# Patient Record
Sex: Female | Born: 1975 | Race: White | Hispanic: No | Marital: Married | Smoking: Never smoker
Health system: Southern US, Community
[De-identification: ages and names within clinical notes are randomized; demographics above are authoritative.]

## PROBLEM LIST (undated history)

## (undated) DIAGNOSIS — J45909 Unspecified asthma, uncomplicated: Secondary | ICD-10-CM

## (undated) DIAGNOSIS — G43909 Migraine, unspecified, not intractable, without status migrainosus: Secondary | ICD-10-CM

## (undated) DIAGNOSIS — I10 Essential (primary) hypertension: Secondary | ICD-10-CM

---

## 2002-08-28 DIAGNOSIS — J45909 Unspecified asthma, uncomplicated: Secondary | ICD-10-CM | POA: Insufficient documentation

## 2006-11-22 DIAGNOSIS — Z23 Encounter for immunization: Secondary | ICD-10-CM | POA: Insufficient documentation

## 2006-11-22 DIAGNOSIS — G43909 Migraine, unspecified, not intractable, without status migrainosus: Secondary | ICD-10-CM | POA: Insufficient documentation

## 2006-11-22 DIAGNOSIS — K219 Gastro-esophageal reflux disease without esophagitis: Secondary | ICD-10-CM | POA: Insufficient documentation

## 2007-02-21 DIAGNOSIS — J019 Acute sinusitis, unspecified: Secondary | ICD-10-CM | POA: Insufficient documentation

## 2007-05-09 DIAGNOSIS — M6740 Ganglion, unspecified site: Secondary | ICD-10-CM | POA: Insufficient documentation

## 2016-03-11 DIAGNOSIS — J383 Other diseases of vocal cords: Secondary | ICD-10-CM | POA: Insufficient documentation

## 2016-03-24 DIAGNOSIS — J4550 Severe persistent asthma, uncomplicated: Secondary | ICD-10-CM | POA: Insufficient documentation

## 2016-04-11 DIAGNOSIS — R490 Dysphonia: Secondary | ICD-10-CM | POA: Insufficient documentation

## 2017-08-08 DIAGNOSIS — F32A Depression, unspecified: Secondary | ICD-10-CM | POA: Insufficient documentation

## 2017-08-08 DIAGNOSIS — I10 Essential (primary) hypertension: Secondary | ICD-10-CM | POA: Insufficient documentation

## 2017-11-24 DIAGNOSIS — M1711 Unilateral primary osteoarthritis, right knee: Secondary | ICD-10-CM | POA: Insufficient documentation

## 2017-11-24 DIAGNOSIS — M1712 Unilateral primary osteoarthritis, left knee: Secondary | ICD-10-CM | POA: Insufficient documentation

## 2018-06-28 ENCOUNTER — Other Ambulatory Visit: Payer: Self-pay

## 2018-06-28 ENCOUNTER — Encounter: Payer: Self-pay | Admitting: Physical Therapy

## 2018-06-28 ENCOUNTER — Ambulatory Visit: Payer: BC Managed Care – PPO | Attending: Orthopedic Surgery | Admitting: Physical Therapy

## 2018-06-28 DIAGNOSIS — M25561 Pain in right knee: Secondary | ICD-10-CM

## 2018-06-28 DIAGNOSIS — M25661 Stiffness of right knee, not elsewhere classified: Secondary | ICD-10-CM | POA: Insufficient documentation

## 2018-06-28 DIAGNOSIS — M6281 Muscle weakness (generalized): Secondary | ICD-10-CM | POA: Diagnosis not present

## 2018-06-28 DIAGNOSIS — R600 Localized edema: Secondary | ICD-10-CM | POA: Diagnosis not present

## 2018-06-28 NOTE — Therapy (Signed)
[facility] [address] [place], [place], [address] Phone: [phone]   Fax:  [phone]  Physical Therapy Evaluation  Patient Details  Name: Jamie Grayeronya Craiger MRN: [id number] Date of Birth: 11/22/1975 Referring Provider (PT): Elijah Birkanile Caffrey MD   Encounter Date: 06/28/2018  PT End of Session - 06/28/18 1156    Visit Number  1    Number of Visits  12    Date for PT Re-Evaluation  08/09/18    Authorization Type  FOTO AT LEAST EVERY 5TH VISIT.  KX MODIFIER AFTER 15 VISITS.  PROGRESS NOTE AT 10TH VISIT.    PT Start Time  1015    PT Stop Time  1116    PT Time Calculation (min)  61 min    Activity Tolerance  Patient tolerated treatment well    Behavior During Therapy  WFL for tasks assessed/performed       History reviewed. No pertinent past medical history.  History reviewed. No pertinent surgical history.  There were no vitals filed for this visit.   Subjective Assessment - 06/28/18 1150    Subjective  COVID-19 screen performed prior to patient entering clinic.  The patient states she slipped on water in her home falling and bedning her right knee backwards resulting in an MCL tear.  She was in an knee immobilizer for several weeks and is now in a hinged brace wiht a paterallr orifice.  Her pain at rest is a 4/10 today but can rise to an 8/10 with increased up time and bending of her right knee.      Pertinent History  Asthma, HTN, Cholecystectomy, tubal ligation.    How long can you sit comfortably?  30 minutes.    How long can you stand comfortably?  <15 minutes.    How long can you walk comfortably?  Short distances around home.    Diagnostic tests  MRI.    Patient Stated Goals  Get back to normal.    Currently in Pain?  Yes    Pain Score  4     Pain Location  Knee    Pain Orientation  Right    Pain Descriptors / Indicators  Aching    Pain Type  Acute pain    Pain Onset  More than a month ago    Pain Frequency  Constant    Aggravating Factors   Increased up time and bending right knee.    Pain Relieving Factors  Rest, ice/heat, meds.         [facility] - 06/28/18 0001      Assessment   Medical Diagnosis  Complete MCL tear.    Referring Provider (PT)  Elijah Birkanile Caffrey MD    Onset Date/Surgical Date  --   05/25/18.     Precautions   Precautions  None      Restrictions   Weight Bearing Restrictions  --   WBAT over right LE with brace.     Balance Screen   Has the patient fallen in the past 6 months  Yes    How many times?  --   1.   Has the patient had a decrease in activity level because of a fear of falling?   No    Is the patient reluctant to leave their home because of a fear of falling?   No      Home Public house managernvironment   Living Environment  Private residence      Prior Function   Level  of Independence  Independent      Observation/Other Assessments   Focus on Therapeutic Outcomes (FOTO)   96% linmitation.      ROM / Strength   AROM / PROM / Strength  AROM;Strength      AROM   Overall AROM Comments  -10 degrees of right knee extension in supine and -5 degrees with gentle passive overpressure.  Flexion= 48 degrees.      Strength   Overall Strength Comments  Righthip flexion= 3-/5.  Patient has nearly no volitional contraction of her right quadriceps.      Palpation   Palpation comment  Very tendet to paltion over right knee medial joint line.      Special Tests   Other special tests  Circumferential measurement right knee (mid-patellar position) 2 cms > left.      Ambulation/Gait   Gait Comments  Antalgic gait wiht right knee brace donned with decreased stance time over her affected right LE.                Objective measurements completed on examination: See above findings.      [facility] Adult PT Treatment/Exercise - 06/28/18 0001      Modalities   Modalities  Electrical Stimulation;Vasopneumatic      Electrical Stimulation   Electrical Stimulation Location   Right medial knee.    Electrical Stimulation Action  Pre-mod.    Electrical Stimulation Parameters  80-150 Hz x 20 minutes.    Electrical Stimulation Goals  Pain      Vasopneumatic   Number Minutes Vasopneumatic   20 minutes    Vasopnuematic Location   --   Right knee.   Vasopneumatic Pressure  Low             PT Education - 06/28/18 1157    Education Details  QS, short duration ice massage.    Person(s) Educated  Patient    Methods  Explanation;Handout    Comprehension  Verbalized understanding;Returned demonstration          PT Long Term Goals - 06/28/18 1247      PT LONG TERM GOAL #1   Title  Independent with a HEP.    Time  6    Period  Weeks    Status  New      PT LONG TERM GOAL #2   Title  Active knee flexion to 120-125 degrees+ so the patient can perform functional tasks and do so with pain not > 2-3/10.    Time  6    Period  Weeks    Status  New      PT LONG TERM GOAL #3   Title  Increase right knee strength to a solid 4+/5 to provide good stability for accomplishment of functional activities.    Time  6    Period  Weeks    Status  New      PT LONG TERM GOAL #4   Title  Full active right knee extension in order to normalize gait.    Time  6    Period  Weeks    Status  New      PT LONG TERM GOAL #5   Title  Walk in clinic wihtout deviation.    Time  6    Period  Weeks             Plan - 06/28/18 1239    Clinical Impression Statement  The patient presents to OPPT with a diagnosis of  a complete tear of her right MCL.  Her pain is very high with increased up time over her right LE and bending.  As expected she has a loss of right knee range of motion and a decrease in her right quadriceps.  She has edema present as well.  Her gait is antalgic with decreased stance time over her right LE.  Patient will benefit from skilled physical therapy intervention to address deficits and pain.    Examination-Activity Limitations  Bathing;Squat     Examination-Participation Restrictions  Community Activity    Stability/Clinical Decision Making  Stable/Uncomplicated    Clinical Decision Making  Low    Rehab Potential  Good    PT Frequency  2x / week    PT Duration  6 weeks    PT Treatment/Interventions  ADLs/Self Care Home Management;Cryotherapy;Electrical Stimulation;Moist Heat;Ultrasound;Therapeutic exercise;Therapeutic activities;Functional mobility training;Stair training;Gait training;Neuromuscular re-education;Patient/family education;Manual techniques;Passive range of motion;Vasopneumatic Device    PT Next Visit Plan  Nustep on level with UE use as well, VMS to right quads, seat knee flexion and heel slides, SLR's, electrical stimulation and vasopneumatic.       Patient will benefit from skilled therapeutic intervention in order to improve the following deficits and impairments:  Pain, Abnormal gait, Decreased range of motion, Decreased strength, Increased edema, Difficulty walking  Visit Diagnosis: Acute pain of right knee - Plan: PT plan of care cert/re-cert  Stiffness of right knee, not elsewhere classified - Plan: PT plan of care cert/re-cert  Muscle weakness (generalized) - Plan: PT plan of care cert/re-cert  Localized edema - Plan: PT plan of care cert/re-cert     Problem List There are no active problems to display for this patient.   Christella App, [place] MPT 06/28/2018, 12:50 PM  [facility] [address] [place], [place], [address] Phone: [phone]   Fax:  [phone]  Name: Blaise Palladino MRN: [id number] Date of Birth: 1976/01/12

## 2018-07-02 ENCOUNTER — Encounter: Payer: Self-pay | Admitting: Physical Therapy

## 2018-07-02 ENCOUNTER — Other Ambulatory Visit: Payer: Self-pay

## 2018-07-02 ENCOUNTER — Ambulatory Visit: Payer: BC Managed Care – PPO | Admitting: Physical Therapy

## 2018-07-02 DIAGNOSIS — M6281 Muscle weakness (generalized): Secondary | ICD-10-CM | POA: Diagnosis not present

## 2018-07-02 DIAGNOSIS — M25561 Pain in right knee: Secondary | ICD-10-CM

## 2018-07-02 DIAGNOSIS — M25661 Stiffness of right knee, not elsewhere classified: Secondary | ICD-10-CM

## 2018-07-02 DIAGNOSIS — R600 Localized edema: Secondary | ICD-10-CM

## 2018-07-02 NOTE — Therapy (Signed)
Jamie Doyle, [place], [address] Phone: [phone]   Fax:  [phone]  Physical Therapy Treatment  Patient Details  Name: Jamie Doyle MRN: [id number] Date of Birth: August 20, 1975 Referring Provider (PT): Jamie Gamma MD   Encounter Date: 07/02/2018  PT End of Session - 07/02/18 1112    Visit Number  2    Number of Visits  12    Date for PT Re-Evaluation  08/09/18    Authorization Type  FOTO AT LEAST EVERY 5TH VISIT.  KX MODIFIER AFTER 15 VISITS.  PROGRESS NOTE AT 10TH VISIT.    PT Start Time  1109    PT Stop Time  1204    PT Time Calculation (min)  55 min    Activity Tolerance  Patient tolerated treatment well    Behavior During Therapy  Jamie Doyle for tasks assessed/performed       History reviewed. No pertinent past medical history.  History reviewed. No pertinent surgical history.  There were no vitals filed for this visit.  Subjective Assessment - 07/02/18 1105    Subjective  COVID 19 screening performed upon arrival at clinic. Reports she still has a lot of pain. This is the first time she has walked without crutches and states that she would not be able to tolerate long distance walking.     Pertinent History  Asthma, HTN, Cholecystectomy, tubal ligation.    How long can you sit comfortably?  30 minutes.    How long can you stand comfortably?  <15 minutes.    How long can you walk comfortably?  Short distances around home.    Diagnostic tests  MRI.    Patient Stated Goals  Get back to normal.    Currently in Pain?  Other (Comment)   No pain assessment provided by patient        [facility] PT Assessment - 07/02/18 0001      Assessment   Medical Diagnosis  Complete MCL tear.    Referring Provider (PT)  Jamie Gamma MD    Onset Date/Surgical Date  05/25/18      Precautions   Precautions  None                   [facility] Adult PT Treatment/Exercise - 07/02/18 0001      Exercises   Exercises   Knee/Hip      Knee/Hip Exercises: Supine   Heel Slides  AROM;Right;3 sets;10 reps      Modalities   Modalities  Corporate treasurer Location  R VMO/Quad    Electrical Stimulation Action  VMS with QS    Electrical Stimulation Parameters  10/10, 300usec, 35 hz x10 min    Electrical Stimulation Goals  Neuromuscular facilitation      Ultrasound   Ultrasound Location  R medial knee    Ultrasound Parameters  1.2 cm, 100%, 1 mhz x10 min    Ultrasound Goals  Pain      Vasopneumatic   Number Minutes Vasopneumatic   15 minutes    Vasopnuematic Location   Knee    Vasopneumatic Pressure  Medium    Vasopneumatic Temperature   34                  PT Long Term Goals - 07/02/18 1137      PT LONG TERM GOAL #1   Title  Independent with a HEP.    Time  6    Period  Weeks    Status  Achieved      PT LONG TERM GOAL #2   Title  Active knee flexion to 120-125 degrees+ so the patient can perform functional tasks and do so with pain not > 2-3/10.    Time  6    Period  Weeks    Status  On-going      PT LONG TERM GOAL #3   Title  Increase right knee strength to a solid 4+/5 to provide good stability for accomplishment of functional activities.    Time  6    Period  Weeks    Status  On-going      PT LONG TERM GOAL #4   Title  Full active right knee extension in order to normalize gait.    Time  6    Period  Weeks    Status  On-going      PT LONG TERM GOAL #5   Title  Walk in clinic wihtout deviation.    Time  6    Period  Weeks    Status  On-going            Plan - 07/02/18 1137    Clinical Impression Statement  Patient presented in clinic with continued pain with ambulation and activities. Patient arrived in clinic with velcro knee brace on R knee. Patient still limited with knee flexion and quad activiation at this time although compliant with HEP. Normal modalities response noted  following removal of the modalities. Antalgia observed during ambulation with the velcro brace donned.     Examination-Activity Limitations  Bathing;Squat    Examination-Participation Restrictions  Community Activity    Stability/Clinical Decision Making  Stable/Uncomplicated    Rehab Potential  Good    PT Frequency  2x / week    PT Duration  6 weeks    PT Treatment/Interventions  ADLs/Self Care Home Management;Cryotherapy;Electrical Stimulation;Moist Heat;Ultrasound;Therapeutic exercise;Therapeutic activities;Functional mobility training;Stair training;Gait training;Neuromuscular re-education;Patient/family education;Manual techniques;Passive range of motion;Vasopneumatic Device    PT Next Visit Plan  Nustep on level with UE use as well, VMS to right quads, seat knee flexion and heel slides, SLR's, electrical stimulation and vasopneumatic.    Consulted and Agree with Plan of Care  Patient       Patient will benefit from skilled therapeutic intervention in order to improve the following deficits and impairments:  Pain, Abnormal gait, Decreased range of motion, Decreased strength, Increased edema, Difficulty walking  Visit Diagnosis: Acute pain of right knee  Stiffness of right knee, not elsewhere classified  Muscle weakness (generalized)  Localized edema     Problem List There are no active problems to display for this patient.   Jamie Doyle, PTA 07/02/2018, 12:20 PM  [facility] [address] [place], [place], [address] Phone: [phone]   Fax:  [phone]  Name: Jamie Grayeronya Doyle MRN: [id number] Date of Birth: 11/27/1975

## 2018-07-04 ENCOUNTER — Encounter: Payer: BC Managed Care – PPO | Admitting: Physical Therapy

## 2018-07-06 ENCOUNTER — Other Ambulatory Visit: Payer: Self-pay

## 2018-07-06 ENCOUNTER — Ambulatory Visit: Payer: BC Managed Care – PPO | Admitting: *Deleted

## 2018-07-06 DIAGNOSIS — R600 Localized edema: Secondary | ICD-10-CM

## 2018-07-06 DIAGNOSIS — M6281 Muscle weakness (generalized): Secondary | ICD-10-CM | POA: Diagnosis not present

## 2018-07-06 DIAGNOSIS — M25561 Pain in right knee: Secondary | ICD-10-CM

## 2018-07-06 DIAGNOSIS — M25661 Stiffness of right knee, not elsewhere classified: Secondary | ICD-10-CM

## 2018-07-06 NOTE — Therapy (Signed)
[facility] [address] [place], [place], [address] Phone: [phone]   Fax:  [phone]  Physical Therapy Treatment  Patient Details  Name: Jamie Doyle MRN: [id number] Date of Birth: 03/13/1975 Referring Provider (PT): Elijah Birkanile Caffrey MD   Encounter Date: 07/06/2018  PT End of Session - 07/06/18 1009    Visit Number  3    Number of Visits  12    Date for PT Re-Evaluation  08/09/18    Authorization Type  FOTO AT LEAST EVERY 5TH VISIT.  KX MODIFIER AFTER 15 VISITS.  PROGRESS NOTE AT 10TH VISIT.    PT Start Time  0910    PT Stop Time  1010    PT Time Calculation (min)  60 min       No past medical history on file.  No past surgical history on file.  There were no vitals filed for this visit.  Subjective Assessment - 07/06/18 0911    Subjective  COVID 19 screening performed upon arrival at clinic. Went to MD yesterday and he said continue with PT x 4 weeks and we will reasses then. Doing about the same.    Pertinent History  Asthma, HTN, Cholecystectomy, tubal ligation.    How long can you sit comfortably?  30 minutes.    How long can you stand comfortably?  <15 minutes.    How long can you walk comfortably?  Short distances around home.    Diagnostic tests  MRI.    Patient Stated Goals  Get back to normal.    Currently in Pain?  Yes    Pain Score  4     Pain Location  Knee    Pain Orientation  Right    Pain Type  Acute pain    Pain Onset  More than a month ago    Pain Frequency  Constant                       [facility] Adult PT Treatment/Exercise - 07/06/18 0001      Exercises   Exercises  Knee/Hip      Knee/Hip Exercises: Supine   Heel Slides  AROM;Right   x 5 mins   Straight Leg Raises  AAROM;Right;4 sets;10 reps   2 sets with hip ER     Modalities   Modalities  Electrical Stimulation;Ultrasound;Vasopneumatic      Programme researcher, broadcasting/film/videolectrical Stimulation   Electrical Stimulation Location  R VMO/Quad    Electrical Stimulation Action  VMS with QS    Electrical Stimulation Parameters  10 secs on/off x 15 mins    Electrical Stimulation Goals  Neuromuscular facilitation      Vasopneumatic   Number Minutes Vasopneumatic   15 minutes    Vasopnuematic Location   Knee    Vasopneumatic Pressure  Medium    Vasopneumatic Temperature   34                  PT Long Term Goals - 07/02/18 1137      PT LONG TERM GOAL #1   Title  Independent with a HEP.    Time  6    Period  Weeks    Status  Achieved      PT LONG TERM GOAL #2   Title  Active knee flexion to 120-125 degrees+ so the patient can perform functional tasks and do so with pain not > 2-3/10.    Time  6    Period  Weeks  Status  On-going      PT LONG TERM GOAL #3   Title  Increase right knee strength to a solid 4+/5 to provide good stability for accomplishment of functional activities.    Time  6    Period  Weeks    Status  On-going      PT LONG TERM GOAL #4   Title  Full active right knee extension in order to normalize gait.    Time  6    Period  Weeks    Status  On-going      PT LONG TERM GOAL #5   Title  Walk in clinic wihtout deviation.    Time  6    Period  Weeks    Status  On-going            Plan - 07/06/18 1012    Clinical Impression Statement  Pt arrived today doing fairly well and reports that she went to see her MD yesterday. He said to continue with PT at this time and he will re-assess in 4 weeks. She did fairly well with Rx today with improved SLR and quad activation. VMS increased to 15 mins today with quad sets and tolerated well Flexion to 102 degrees today.Normal modality response today after removal of modalities.    Examination-Activity Limitations  Bathing;Squat    Examination-Participation Restrictions  Community Activity    Stability/Clinical Decision Making  Stable/Uncomplicated    Rehab Potential  Good    PT Frequency  2x / week    PT Duration  6 weeks    PT  Treatment/Interventions  ADLs/Self Care Home Management;Cryotherapy;Electrical Stimulation;Moist Heat;Ultrasound;Therapeutic exercise;Therapeutic activities;Functional mobility training;Stair training;Gait training;Neuromuscular re-education;Patient/family education;Manual techniques;Passive range of motion;Vasopneumatic Device    PT Next Visit Plan  Nustep on level with UE use as well, VMS to right quads, seat knee flexion and heel slides, SLR's, electrical stimulation and vasopneumatic.       Patient will benefit from skilled therapeutic intervention in order to improve the following deficits and impairments:  Pain, Abnormal gait, Decreased range of motion, Decreased strength, Increased edema, Difficulty walking  Visit Diagnosis:  Acute pain of right knee   Stiffness of right knee, not elsewhere classified   Muscle weakness (generalized)   Localized edema *  Problem List There are no active problems to display for this patient.   Starr Urias,[place], PTA 07/06/2018, 11:10 AM  [facility] [place], [place], [address] Phone: [phone]   Fax:  [phone]  Name: Evelyn Aguinaldo MRN: [id number] Date of Birth: 1975/03/22

## 2018-07-09 ENCOUNTER — Other Ambulatory Visit: Payer: Self-pay

## 2018-07-09 ENCOUNTER — Ambulatory Visit: Payer: BC Managed Care – PPO | Admitting: Physical Therapy

## 2018-07-09 DIAGNOSIS — M25661 Stiffness of right knee, not elsewhere classified: Secondary | ICD-10-CM | POA: Diagnosis not present

## 2018-07-09 DIAGNOSIS — M25561 Pain in right knee: Secondary | ICD-10-CM

## 2018-07-09 DIAGNOSIS — M6281 Muscle weakness (generalized): Secondary | ICD-10-CM | POA: Diagnosis not present

## 2018-07-09 DIAGNOSIS — R600 Localized edema: Secondary | ICD-10-CM | POA: Diagnosis not present

## 2018-07-09 NOTE — Therapy (Signed)
[facility] [place], [place], [address] Phone: [phone]   Fax:  [phone]  Physical Therapy Treatment  Patient Details  Name: Jamie Doyle MRN: [id number] Date of Birth: December 30, 1975 Referring Provider (PT): Jovita Gamma MD   Encounter Date: 07/09/2018  PT End of Session - 07/09/18 1541    Visit Number  4    Number of Visits  12    Date for PT Re-Evaluation  08/09/18    Authorization Type  FOTO AT LEAST EVERY 5TH VISIT.  KX MODIFIER AFTER 15 VISITS.  PROGRESS NOTE AT 10TH VISIT.    PT Start Time  0204    PT Stop Time  0309    PT Time Calculation (min)  65 min    Activity Tolerance  Patient tolerated treatment well    Behavior During Therapy  [facility] for tasks assessed/performed       No past medical history on file.  No past surgical history on file.  There were no vitals filed for this visit.  Subjective Assessment - 07/09/18 1533    Subjective  COVID-19 screen performed prior to patient entering clinic.  Outside at ball games this weekend.  Took my brace off and got a sunburn on my legs.    Pertinent History  Asthma, HTN, Cholecystectomy, tubal ligation.    How long can you sit comfortably?  30 minutes.    How long can you stand comfortably?  <15 minutes.    How long can you walk comfortably?  Short distances around home.    Diagnostic tests  MRI.    Patient Stated Goals  Get back to normal.    Currently in Pain?  Yes    Pain Score  4     Pain Location  Knee    Pain Orientation  Right    Pain Descriptors / Indicators  Aching    Pain Type  Acute pain    Pain Onset  More than a month ago                       [facility] Adult PT Treatment/Exercise - 07/09/18 0001      Exercises   Exercises  Knee/Hip      Knee/Hip Exercises: Aerobic   Nustep  Level 1 using UE to propel LE's x 15 minutes moving seat forward x 2 to increase left knee flexion.      Knee/Hip Exercises: Supine   Short Arc Quad  Sets Limitations  15 minutes facilitated with VMS to patient's right medial quads (10 sec extension holds and 10 sec rest).      Modalities   Modalities  Vasopneumatic      Electrical Stimulation   Electrical Stimulation Location  Right medial knee.    Electrical Stimulation Action  Pre-mod.    Electrical Stimulation Parameters  80-150 Hz x 20 minutes.    Electrical Stimulation Goals  Pain                  PT Long Term Goals - 07/02/18 1137      PT LONG TERM GOAL #1   Title  Independent with a HEP.    Time  6    Period  Weeks    Status  Achieved      PT LONG TERM GOAL #2   Title  Active knee flexion to 120-125 degrees+ so the patient can perform functional tasks and do so with pain not > 2-3/10.  Time  6    Period  Weeks    Status  On-going      PT LONG TERM GOAL #3   Title  Increase right knee strength to a solid 4+/5 to provide good stability for accomplishment of functional activities.    Time  6    Period  Weeks    Status  On-going      PT LONG TERM GOAL #4   Title  Full active right knee extension in order to normalize gait.    Time  6    Period  Weeks    Status  On-going      PT LONG TERM GOAL #5   Title  Walk in clinic wihtout deviation.    Time  6    Period  Weeks    Status  On-going            Plan - 07/09/18 1538    Clinical Impression Statement  Excellent job today with the addition of the Nustep and progression to SAQ's today.  Patient with sunburn on leg due to being outside at ball games without brace.    Stability/Clinical Decision Making  Stable/Uncomplicated    Rehab Potential  Good    PT Frequency  2x / week    PT Duration  6 weeks    PT Treatment/Interventions  ADLs/Self Care Home Management;Cryotherapy;Electrical Stimulation;Moist Heat;Ultrasound;Therapeutic exercise;Therapeutic activities;Functional mobility training;Stair training;Gait training;Neuromuscular re-education;Patient/family education;Manual techniques;Passive  range of motion;Vasopneumatic Device    PT Next Visit Plan  Nustep on level with UE use as well, VMS to right quads, seat knee flexion and heel slides, SLR's, electrical stimulation and vasopneumatic.    Consulted and Agree with Plan of Care  Patient       Patient will benefit from skilled therapeutic intervention in order to improve the following deficits and impairments:  Pain, Abnormal gait, Decreased range of motion, Decreased strength, Increased edema, Difficulty walking  Visit Diagnosis: Acute pain of right knee.    Problem List There are no active problems to display for this patient.   Jamie Doyle, [place] MPT 07/09/2018, 3:42 PM  [facility] [address] [place], [place], [address] Phone: [phone]   Fax:  [phone]  Name: Jamie Doyle MRN: [id number] Date of Birth: 08/28/1975

## 2018-07-11 ENCOUNTER — Ambulatory Visit: Payer: BC Managed Care – PPO | Admitting: Physical Therapy

## 2018-07-11 ENCOUNTER — Other Ambulatory Visit: Payer: Self-pay

## 2018-07-11 DIAGNOSIS — R600 Localized edema: Secondary | ICD-10-CM

## 2018-07-11 DIAGNOSIS — M25561 Pain in right knee: Secondary | ICD-10-CM | POA: Diagnosis not present

## 2018-07-11 DIAGNOSIS — M6281 Muscle weakness (generalized): Secondary | ICD-10-CM | POA: Diagnosis not present

## 2018-07-11 DIAGNOSIS — M25661 Stiffness of right knee, not elsewhere classified: Secondary | ICD-10-CM

## 2018-07-11 NOTE — Therapy (Signed)
[facility] [place], [place], [address] Phone: [phone]   Fax:  [phone]  Physical Therapy Treatment  Patient Details  Name: Jamie Doyle MRN: [id number] Date of Birth: February 18, 1975 Referring Provider (PT): Jovita Gamma MD   Encounter Date: 07/11/2018  PT End of Session - 07/11/18 1347    Visit Number  5    Number of Visits  12    Date for PT Re-Evaluation  08/09/18    Authorization Type  FOTO AT LEAST EVERY 5TH VISIT.  KX MODIFIER AFTER 15 VISITS.  PROGRESS NOTE AT 10TH VISIT.    PT Start Time  0100    PT Stop Time  0158    PT Time Calculation (min)  58 min    Activity Tolerance  Patient tolerated treatment well    Behavior During Therapy  [facility] for tasks assessed/performed       No past medical history on file.  No past surgical history on file.  There were no vitals filed for this visit.                    [facility] Adult PT Treatment/Exercise - 07/11/18 0001      Exercises   Exercises  Knee/Hip      Knee/Hip Exercises: Aerobic   Nustep  Level 2 moving forward x 2 to increase right knee flexion (brace on) x 15 minutes.      Knee/Hip Exercises: Supine   Short Arc Quad Sets Limitations  16 minutes facilitated with Bi-Phasic e'stim to medial quads (10 sec on and 10 sec rest).      Acupuncturist Location  Right medial knee.    Electrical Stimulation Action  Pre-mod.    Electrical Stimulation Parameters  80-150 Hz x 20 minutes.    Electrical Stimulation Goals  Pain                  PT Long Term Goals - 07/02/18 1137      PT LONG TERM GOAL #1   Title  Independent with a HEP.    Time  6    Period  Weeks    Status  Achieved      PT LONG TERM GOAL #2   Title  Active knee flexion to 120-125 degrees+ so the patient can perform functional tasks and do so with pain not > 2-3/10.    Time  6    Period  Weeks    Status  On-going      PT LONG TERM  GOAL #3   Title  Increase right knee strength to a solid 4+/5 to provide good stability for accomplishment of functional activities.    Time  6    Period  Weeks    Status  On-going      PT LONG TERM GOAL #4   Title  Full active right knee extension in order to normalize gait.    Time  6    Period  Weeks    Status  On-going      PT LONG TERM GOAL #5   Title  Walk in clinic wihtout deviation.    Time  6    Period  Weeks    Status  On-going            Plan - 07/11/18 1359    Clinical Impression Statement  Excellent progress thus far with right knee pain decreasing and range of motion increasing.  PT Treatment/Interventions  ADLs/Self Care Home Management;Cryotherapy;Electrical Stimulation;Moist Heat;Ultrasound;Therapeutic exercise;Therapeutic activities;Functional mobility training;Stair training;Gait training;Neuromuscular re-education;Patient/family education;Manual techniques;Passive range of motion;Vasopneumatic Device    PT Next Visit Plan  Nustep on level with UE use as well, VMS to right quads, seat knee flexion and heel slides, SLR's, electrical stimulation and vasopneumatic.       Patient will benefit from skilled therapeutic intervention in order to improve the following deficits and impairments:     Visit Diagnosis: 1. Acute pain of right knee   2. Stiffness of right knee, not elsewhere classified   3. Muscle weakness (generalized)   4. Localized edema        Problem List There are no active problems to display for this patient.   Jerami Tammen, [place] MPT 07/11/2018, 2:00 PM  [facility] [address] [place], [place], [address] Phone: [phone]   Fax:  [phone]  Name: Jamie Grayeronya Craiger MRN: [id number] Date of Birth: 08/05/1975

## 2018-07-16 ENCOUNTER — Ambulatory Visit: Payer: BC Managed Care – PPO | Admitting: Physical Therapy

## 2018-07-16 ENCOUNTER — Other Ambulatory Visit: Payer: Self-pay

## 2018-07-16 DIAGNOSIS — R600 Localized edema: Secondary | ICD-10-CM | POA: Diagnosis not present

## 2018-07-16 DIAGNOSIS — M6281 Muscle weakness (generalized): Secondary | ICD-10-CM

## 2018-07-16 DIAGNOSIS — M25561 Pain in right knee: Secondary | ICD-10-CM

## 2018-07-16 DIAGNOSIS — M25661 Stiffness of right knee, not elsewhere classified: Secondary | ICD-10-CM | POA: Diagnosis not present

## 2018-07-16 NOTE — Therapy (Signed)
[facility] [address] [place], [place], [address] Phone: [phone]   Fax:  [phone]  Physical Therapy Treatment  Patient Details  Name: Jamie Doyle MRN: [id number] Date of Birth: 03/04/1975 Referring Provider (PT): Elijah Birkanile Caffrey MD   Encounter Date: 07/16/2018  PT End of Session - 07/16/18 1328    Visit Number  6    Number of Visits  12    Date for PT Re-Evaluation  08/09/18    Authorization Type  FOTO AT LEAST EVERY 5TH VISIT.  KX MODIFIER AFTER 15 VISITS.  PROGRESS NOTE AT 10TH VISIT.    PT Start Time  1259    PT Stop Time  1409    PT Time Calculation (min)  70 min    Activity Tolerance  Patient tolerated treatment well       No past medical history on file.  No past surgical history on file.  There were no vitals filed for this visit.  Subjective Assessment - 07/16/18 1307    Subjective  COVID-19 screen performed prior to patient entering clinic.  knnes feeling good.    Currently in Pain?  Yes    Pain Score  2     Pain Location  Knee    Pain Orientation  Right    Pain Descriptors / Indicators  Aching    Pain Type  Acute pain    Pain Onset  More than a month ago                       [facility]/Ex[facility] - 07/16/18 0001      Exercises   Exercises  Knee/Hip      Knee/Hip Exercises: Aerobic   Nustep  Level 2 moving forward x 3 to increase right knee flexion x 15 minutes.      Knee/Hip Exercises: Supine   Short Arc Quad Sets Limitations  2# SAQ's x 16 minutes facilitated with Bi-Phasic e'stim to right medial quads with 10 sec extension holds and 10 sec rest).      Modalities   Modalities  Electrical Stimulation;Vasopneumatic      Electrical Stimulation   Electrical Stimulation Location  Right medial knee.    Electrical Stimulation Action  Pre-mod.    Electrical Stimulation Parameters  80-150 Hz x 20 minutes.    Electrical Stimulation Goals  Pain      Vasopneumatic   Number Minutes Vasopneumatic   20 minutes    Vasopnuematic Location   --   Right knee.   Vasopneumatic Pressure  Medium                  PT Long Term Goals - 07/02/18 1137      PT LONG TERM GOAL #1   Title  Independent with a HEP.    Time  6    Period  Weeks    Status  Achieved      PT LONG TERM GOAL #2   Title  Active knee flexion to 120-125 degrees+ so the patient can perform functional tasks and do so with pain not > 2-3/10.    Time  6    Period  Weeks    Status  On-going      PT LONG TERM GOAL #3   Title  Increase right knee strength to a solid 4+/5 to provide good stability for accomplishment of functional activities.    Time  6    Period  Weeks  Status  On-going      PT LONG TERM GOAL #4   Title  Full active right knee extension in order to normalize gait.    Time  6    Period  Weeks    Status  On-going      PT LONG TERM GOAL #5   Title  Walk in clinic wihtout deviation.    Time  6    Period  Weeks    Status  On-going            Plan - 07/16/18 1332    Clinical Impression Statement  Patient progressing well with less reported pain though she still has palpable pain over her right medial knee region.  Her right knee flexion has improved significantly.    Examination-Activity Limitations  Bathing;Squat    Examination-Participation Restrictions  Community Activity    Stability/Clinical Decision Making  Stable/Uncomplicated    PT Treatment/Interventions  ADLs/Self Care Home Management;Cryotherapy;Electrical Stimulation;Moist Heat;Ultrasound;Therapeutic exercise;Therapeutic activities;Functional mobility training;Stair training;Gait training;Neuromuscular re-education;Patient/family education;Manual techniques;Passive range of motion;Vasopneumatic Device    PT Next Visit Plan  Stationary bike, may consider combo e'stim/U/S to right medial knee region.       Patient will benefit from skilled therapeutic intervention in order to improve the  following deficits and impairments:  Pain, Abnormal gait, Decreased range of motion, Decreased strength, Increased edema, Difficulty walking  Visit Diagnosis: 1. Acute pain of right knee   2. Stiffness of right knee, not elsewhere classified   3. Muscle weakness (generalized)   4. Localized edema        Problem List There are no active problems to display for this patient.   Jamie Doyle, [place] MPT 07/16/2018, 2:19 PM  [facility] [address] [place], [place], [address] Phone: [phone]   Fax:  [phone]  Name: Jamie Doyle MRN: [id number] Date of Birth: 04-22-1975

## 2018-07-18 ENCOUNTER — Encounter: Payer: BC Managed Care – PPO | Admitting: Physical Therapy

## 2018-08-06 ENCOUNTER — Ambulatory Visit: Payer: BC Managed Care – PPO | Admitting: Physical Therapy

## 2018-08-08 ENCOUNTER — Encounter: Payer: Self-pay | Admitting: Physical Therapy

## 2018-08-08 ENCOUNTER — Other Ambulatory Visit: Payer: Self-pay

## 2018-08-08 ENCOUNTER — Ambulatory Visit: Payer: BC Managed Care – PPO | Attending: Orthopedic Surgery | Admitting: Physical Therapy

## 2018-08-08 DIAGNOSIS — M25561 Pain in right knee: Secondary | ICD-10-CM | POA: Diagnosis not present

## 2018-08-08 DIAGNOSIS — M6281 Muscle weakness (generalized): Secondary | ICD-10-CM | POA: Diagnosis not present

## 2018-08-08 DIAGNOSIS — M25661 Stiffness of right knee, not elsewhere classified: Secondary | ICD-10-CM | POA: Insufficient documentation

## 2018-08-08 DIAGNOSIS — R600 Localized edema: Secondary | ICD-10-CM | POA: Diagnosis not present

## 2018-08-08 NOTE — Therapy (Signed)
[facility] [place], [place], [address] Phone: [phone]   Fax:  [phone]  Physical Therapy Treatment  Patient Details  Name: Jamie Doyle MRN: [id number] Date of Birth: [birth year] Referring Provider (PT): Jovita Gamma MD   Encounter Date: 08/08/2018  PT End of Session - 08/08/18 1120    Visit Number  7    Number of Visits  12    Date for PT Re-Evaluation  08/09/18    Authorization Type  FOTO AT LEAST EVERY 5TH VISIT.  KX MODIFIER AFTER 15 VISITS.  PROGRESS NOTE AT 10TH VISIT.    PT Start Time  1120    PT Stop Time  1208    PT Time Calculation (min)  48 min    Activity Tolerance  Patient tolerated treatment well    Behavior During Therapy  [facility] for tasks assessed/performed       History reviewed. No pertinent past medical history.  History reviewed. No pertinent surgical history.  There were no vitals filed for this visit.  Subjective Assessment - 08/08/18 1118    Subjective  COVID 19 screening performed on patient upon arrival. Patient reports that MD said surgery is still an option even through strength is improving but stability is not.    Pertinent History  Asthma, HTN, Cholecystectomy, tubal ligation.    How long can you sit comfortably?  30 minutes.    How long can you stand comfortably?  <15 minutes.    How long can you walk comfortably?  Short distances around home.    Diagnostic tests  MRI.    Patient Stated Goals  Get back to normal.    Currently in Pain?  --   No pain assessment provided by patient        [facility] PT Assessment - 08/08/18 0001      Assessment   Medical Diagnosis  Complete MCL tear.    Referring Provider (PT)  Jovita Gamma MD    Onset Date/Surgical Date  05/25/18    Next MD Visit  08/28/2018      Precautions   Precautions  None      ROM / Strength   AROM / PROM / Strength  AROM      AROM   Overall AROM   Within functional limits for tasks performed    AROM Assessment  Site  Knee    Right/Left Knee  Right    Right Knee Extension  0    Right Knee Flexion  131                   [facility] Adult PT Treatment/Exercise - 08/08/18 0001      Knee/Hip Exercises: Aerobic   Nustep  L4 x15 min      Knee/Hip Exercises: Standing   SLS  RLE SLS 2x10 sec holds      Knee/Hip Exercises: Supine   Short Arc Quad Sets  Right;Other (comment)   VMS to R VMO/Quad     Modalities   Modalities  Psychologist, educational Location  R VMO/Quad    Electrical Stimulation Action  VMS with SAQ    Electrical Stimulation Parameters  10/10, 300 usec x10 min    Electrical Stimulation Goals  Neuromuscular facilitation      Vasopneumatic   Number Minutes Vasopneumatic   10 minutes    Vasopnuematic Location   Knee    Vasopneumatic Pressure  Low  Vasopneumatic Temperature   34                  PT Long Term Goals - 08/08/18 1222      PT LONG TERM GOAL #1   Title  Independent with a HEP.    Time  6    Period  Weeks    Status  Achieved      PT LONG TERM GOAL #2   Title  Active knee flexion to 120-125 degrees+ so the patient can perform functional tasks and do so with pain not > 2-3/10.    Time  6    Period  Weeks    Status  Achieved      PT LONG TERM GOAL #3   Title  Increase right knee strength to a solid 4+/5 to provide good stability for accomplishment of functional activities.    Time  6    Period  Weeks    Status  On-going      PT LONG TERM GOAL #4   Title  Full active right knee extension in order to normalize gait.    Time  6    Period  Weeks    Status  Achieved      PT LONG TERM GOAL #5   Title  Walk in clinic wihtout deviation.    Time  6    Period  Weeks    Status  On-going            Plan - 08/08/18 1158    Clinical Impression Statement  Paitent presented in clinic with reports of continued intermittant buckling of R knee. Patient still feels safer with  brace donned. RLE SLS attempted in standing with brace donned. Patient did report some instability and buckling type sensation during SLS. VMS with SAQ continued to further improve neuro re-ed of quad. AROM of R knee measured as 0-131 deg. Normal modalities response noted following removal of the modalities.    Examination-Activity Limitations  Bathing;Squat    Examination-Participation Restrictions  Community Activity    Stability/Clinical Decision Making  Stable/Uncomplicated    Rehab Potential  Good    PT Frequency  2x / week    PT Duration  6 weeks    PT Treatment/Interventions  ADLs/Self Care Home Management;Cryotherapy;Electrical Stimulation;Moist Heat;Ultrasound;Therapeutic exercise;Therapeutic activities;Functional mobility training;Stair training;Gait training;Neuromuscular re-education;Patient/family education;Manual techniques;Passive range of motion;Vasopneumatic Device    PT Next Visit Plan  Stationary bike, may consider combo e'stim/U/S to right medial knee region.    Consulted and Agree with Plan of Care  Patient       Patient will benefit from skilled therapeutic intervention in order to improve the following deficits and impairments:  Pain, Abnormal gait, Decreased range of motion, Decreased strength, Increased edema, Difficulty walking  Visit Diagnosis: 1. Acute pain of right knee   2. Stiffness of right knee, not elsewhere classified   3. Muscle weakness (generalized)   4. Localized edema        Problem List There are no active problems to display for this patient.   Marvell FullerKelsey P Kennon, PTA 08/08/2018, 12:23 PM  [facility] [address] [place], [place], [address] Phone: [phone]   Fax:  [phone]  Name: Jamie Doyle MRN: [id number] Date of Birth: 12/21/1975

## 2018-08-15 ENCOUNTER — Ambulatory Visit: Payer: BC Managed Care – PPO | Admitting: Physical Therapy

## 2018-08-15 ENCOUNTER — Other Ambulatory Visit: Payer: Self-pay

## 2018-08-15 DIAGNOSIS — R600 Localized edema: Secondary | ICD-10-CM

## 2018-08-15 DIAGNOSIS — M6281 Muscle weakness (generalized): Secondary | ICD-10-CM

## 2018-08-15 DIAGNOSIS — M25561 Pain in right knee: Secondary | ICD-10-CM | POA: Diagnosis not present

## 2018-08-15 DIAGNOSIS — M25661 Stiffness of right knee, not elsewhere classified: Secondary | ICD-10-CM

## 2018-08-15 NOTE — Therapy (Signed)
[facility] [place], [place], [address] Phone: [phone]   Fax:  [phone]  Physical Therapy Treatment  Patient Details  Name: Jamie Doyle MRN: [id number] Date of Birth: July 14, 1975 Referring Provider (PT): Jovita Gamma MD   Encounter Date: 08/15/2018  PT End of Session - 08/15/18 1236    Visit Number  8    Number of Visits  12    Date for PT Re-Evaluation  09/13/18    Authorization Type  FOTO AT LEAST EVERY 5TH VISIT.  KX MODIFIER AFTER 15 VISITS.  PROGRESS NOTE AT 10TH VISIT.    PT Start Time  1115    PT Stop Time  1207    PT Time Calculation (min)  52 min    Activity Tolerance  Patient tolerated treatment well    Behavior During Therapy  [facility] for tasks assessed/performed       No past medical history on file.  No past surgical history on file.  There were no vitals filed for this visit.  Subjective Assessment - 08/15/18 1229    Subjective  COVID-19 screen performed prior to patient entering clinic.  Knee feels stronger but still hurts and feels like it will give way without the brace.    Pertinent History  Asthma, HTN, Cholecystectomy, tubal ligation.    How long can you sit comfortably?  30 minutes.    How long can you stand comfortably?  <15 minutes.    How long can you walk comfortably?  Short distances around home.    Diagnostic tests  MRI.    Patient Stated Goals  Get back to normal.    Currently in Pain?  Yes    Pain Score  6     Pain Location  Knee    Pain Orientation  Right    Pain Descriptors / Indicators  Aching    Pain Type  Acute pain    Pain Onset  More than a month ago                       [facility] Adult PT Treatment/Exercise - 08/15/18 0001      Exercises   Exercises  Knee/Hip      Knee/Hip Exercises: Aerobic   Nustep  Level 4 x 17 minutes.      Knee/Hip Exercises: Machines for Strengthening   Cybex Knee Extension  10# 90 degrees to 45 degrees (pain-free) x 4  minutes.    Cybex Knee Flexion  30# x 4 minutes.      Modalities   Modalities  Health visitor Stimulation Location  Right knee.    Electrical Stimulation Action  IFC x 20 minutes.      Vasopneumatic   Number Minutes Vasopneumatic   20 minutes    Vasopnuematic Location   --   Right knee.   Vasopneumatic Pressure  Medium                  PT Long Term Goals - 08/08/18 1222      PT LONG TERM GOAL #1   Title  Independent with a HEP.    Time  6    Period  Weeks    Status  Achieved      PT LONG TERM GOAL #2   Title  Active knee flexion to 120-125 degrees+ so the patient can perform functional tasks and do so with pain not > 2-3/10.  Time  6    Period  Weeks    Status  Achieved      PT LONG TERM GOAL #3   Title  Increase right knee strength to a solid 4+/5 to provide good stability for accomplishment of functional activities.    Time  6    Period  Weeks    Status  On-going      PT LONG TERM GOAL #4   Title  Full active right knee extension in order to normalize gait.    Time  6    Period  Weeks    Status  Achieved      PT LONG TERM GOAL #5   Title  Walk in clinic wihtout deviation.    Time  6    Period  Weeks    Status  On-going            Plan - 08/15/18 1233    Clinical Impression Statement  Patient with continued palpable tenderness over her right knee medial joint line.  She did well with resisted exercise with knee extension 90 to 45 degrees which was a pain-free zone for her.  She is getting a second MD opinion.    PT Treatment/Interventions  ADLs/Self Care Home Management;Cryotherapy;Electrical Stimulation;Moist Heat;Ultrasound;Therapeutic exercise;Therapeutic activities;Functional mobility training;Stair training;Gait training;Neuromuscular re-education;Patient/family education;Manual techniques;Passive range of motion;Vasopneumatic Device    PT Next Visit Plan  Stationary bike, may  consider combo e'stim/U/S to right medial knee region.       Patient will benefit from skilled therapeutic intervention in order to improve the following deficits and impairments:  Pain, Abnormal gait, Decreased range of motion, Decreased strength, Increased edema, Difficulty walking  Visit Diagnosis: 1. Acute pain of right knee   2. Stiffness of right knee, not elsewhere classified   3. Muscle weakness (generalized)   4. Localized edema        Problem List There are no active problems to display for this patient.   Kirrah Mustin, [place] MPT 08/15/2018, 12:37 PM  [facility] [address] [place], [place], [address] Phone: [phone]   Fax:  [phone]  Name: Jamie Grayeronya Craiger MRN: [id number] Date of Birth: 01/28/1975

## 2018-08-17 DIAGNOSIS — Z9851 Tubal ligation status: Secondary | ICD-10-CM | POA: Diagnosis not present

## 2018-08-17 DIAGNOSIS — N3090 Cystitis, unspecified without hematuria: Secondary | ICD-10-CM | POA: Diagnosis not present

## 2018-08-17 DIAGNOSIS — Z880 Allergy status to penicillin: Secondary | ICD-10-CM | POA: Diagnosis not present

## 2018-08-17 DIAGNOSIS — Z9049 Acquired absence of other specified parts of digestive tract: Secondary | ICD-10-CM | POA: Diagnosis not present

## 2018-08-17 DIAGNOSIS — K5901 Slow transit constipation: Secondary | ICD-10-CM | POA: Diagnosis not present

## 2018-08-17 DIAGNOSIS — Z975 Presence of (intrauterine) contraceptive device: Secondary | ICD-10-CM | POA: Diagnosis not present

## 2018-08-17 DIAGNOSIS — Z79899 Other long term (current) drug therapy: Secondary | ICD-10-CM | POA: Diagnosis not present

## 2018-08-17 DIAGNOSIS — Z888 Allergy status to other drugs, medicaments and biological substances status: Secondary | ICD-10-CM | POA: Diagnosis not present

## 2018-08-17 DIAGNOSIS — N3000 Acute cystitis without hematuria: Secondary | ICD-10-CM | POA: Diagnosis not present

## 2018-08-17 DIAGNOSIS — R1031 Right lower quadrant pain: Secondary | ICD-10-CM | POA: Diagnosis not present

## 2018-08-17 DIAGNOSIS — Z885 Allergy status to narcotic agent status: Secondary | ICD-10-CM | POA: Diagnosis not present

## 2018-08-17 DIAGNOSIS — R109 Unspecified abdominal pain: Secondary | ICD-10-CM | POA: Diagnosis not present

## 2018-08-22 ENCOUNTER — Other Ambulatory Visit: Payer: Self-pay

## 2018-08-22 ENCOUNTER — Encounter: Payer: Self-pay | Admitting: Physical Therapy

## 2018-08-22 ENCOUNTER — Ambulatory Visit: Payer: BC Managed Care – PPO | Admitting: Physical Therapy

## 2018-08-22 DIAGNOSIS — R600 Localized edema: Secondary | ICD-10-CM | POA: Diagnosis not present

## 2018-08-22 DIAGNOSIS — M6281 Muscle weakness (generalized): Secondary | ICD-10-CM | POA: Diagnosis not present

## 2018-08-22 DIAGNOSIS — M25661 Stiffness of right knee, not elsewhere classified: Secondary | ICD-10-CM | POA: Diagnosis not present

## 2018-08-22 DIAGNOSIS — M25561 Pain in right knee: Secondary | ICD-10-CM | POA: Diagnosis not present

## 2018-08-22 NOTE — Therapy (Signed)
[facility] [place], [place], [address] Phone: [phone]   Fax:  [phone]  Physical Therapy Treatment  Patient Details  Name: Chanell Nadeau MRN: [id number] Date of Birth: Sep 18, 1975 Referring Provider (PT): Jovita Gamma MD   Encounter Date: 08/22/2018  PT End of Session - 08/22/18 1140    Visit Number  9    Number of Visits  12    Date for PT Re-Evaluation  09/13/18    Authorization Type  FOTO AT LEAST EVERY 5TH VISIT.  KX MODIFIER AFTER 15 VISITS.  PROGRESS NOTE AT 10TH VISIT.    PT Start Time  1118    PT Stop Time  1203    PT Time Calculation (min)  45 min    Activity Tolerance  Patient tolerated treatment well    Behavior During Therapy  [facility] for tasks assessed/performed       History reviewed. No pertinent past medical history.  History reviewed. No pertinent surgical history.  There were no vitals filed for this visit.  Subjective Assessment - 08/22/18 1128    Subjective  COVID 19 screening performed on patient upon arrival. Patient reports that she is trying to go without her brace as it is hot today. Reports that she can feel a difference without a brace.    Pertinent History  Asthma, HTN, Cholecystectomy, tubal ligation.    How long can you sit comfortably?  30 minutes.    How long can you stand comfortably?  <15 minutes.    How long can you walk comfortably?  Short distances around home.    Diagnostic tests  MRI.    Patient Stated Goals  Get back to normal.    Currently in Pain?  Yes    Pain Score  6     Pain Location  Knee    Pain Orientation  Right    Pain Descriptors / Indicators  Discomfort    Pain Type  Acute pain    Pain Onset  More than a month ago         [facility] PT Assessment - 08/22/18 0001      Assessment   Medical Diagnosis  Complete MCL tear.    Referring Provider (PT)  Jovita Gamma MD    Onset Date/Surgical Date  05/25/18    Next MD Visit  08/28/2018      Precautions   Precautions  None                   [facility] Adult PT Treatment/Exercise - 08/22/18 0001      Knee/Hip Exercises: Aerobic   Nustep  Level 4 x 15 minutes.      Knee/Hip Exercises: Machines for Strengthening   Cybex Knee Extension  10# 90 degrees to 45 degrees (pain-free) x20 reps    Cybex Knee Flexion  30# x 20 reps      Knee/Hip Exercises: Supine   Hip Adduction Isometric  Strengthening;Both;15 reps    Straight Leg Raises  AROM;Right;15 reps      Modalities   Modalities  Vasopneumatic      Vasopneumatic   Number Minutes Vasopneumatic   10 minutes    Vasopnuematic Location   Knee    Vasopneumatic Pressure  Medium    Vasopneumatic Temperature   49                  PT Long Term Goals - 08/08/18 1222      PT LONG TERM GOAL #1  Title  Independent with a HEP.    Time  6    Period  Weeks    Status  Achieved      PT LONG TERM GOAL #2   Title  Active knee flexion to 120-125 degrees+ so the patient can perform functional tasks and do so with pain not > 2-3/10.    Time  6    Period  Weeks    Status  Achieved      PT LONG TERM GOAL #3   Title  Increase right knee strength to a solid 4+/5 to provide good stability for accomplishment of functional activities.    Time  6    Period  Weeks    Status  On-going      PT LONG TERM GOAL #4   Title  Full active right knee extension in order to normalize gait.    Time  6    Period  Weeks    Status  Achieved      PT LONG TERM GOAL #5   Title  Walk in clinic wihtout deviation.    Time  6    Period  Weeks    Status  On-going            Plan - 08/22/18 1154    Clinical Impression Statement  Patient presented in clinic with continued reports of discomfort and instability especially without knee brace donned. Patient still very tender to R medial knee and edema present along medial knee as well. Patient limited with therex due to discomfort and instability. Normal vasopnuematic response noted following  removal of the modality. Goal assessment limited due to continued pain that may limit MMT and ADLs.    Examination-Activity Limitations  Bathing;Squat    Examination-Participation Restrictions  Community Activity    Stability/Clinical Decision Making  Stable/Uncomplicated    Rehab Potential  Good    PT Frequency  2x / week    PT Duration  6 weeks    PT Treatment/Interventions  ADLs/Self Care Home Management;Cryotherapy;Electrical Stimulation;Moist Heat;Ultrasound;Therapeutic exercise;Therapeutic activities;Functional mobility training;Stair training;Gait training;Neuromuscular re-education;Patient/family education;Manual techniques;Passive range of motion;Vasopneumatic Device    PT Next Visit Plan  Stationary bike, may consider combo e'stim/U/S to right medial knee region.    Consulted and Agree with Plan of Care  Patient       Patient will benefit from skilled therapeutic intervention in order to improve the following deficits and impairments:  Pain, Abnormal gait, Decreased range of motion, Decreased strength, Increased edema, Difficulty walking  Visit Diagnosis: 1. Acute pain of right knee   2. Stiffness of right knee, not elsewhere classified   3. Muscle weakness (generalized)   4. Localized edema        Problem List There are no active problems to display for this patient.   Marvell FullerKelsey P Yona Kosek, PTA 08/22/2018, 12:13 PM  [facility] [address] [place], [place], [address] Phone: [phone]   Fax:  [phone]  Name: Redgie Grayeronya Craiger MRN: [id number] Date of Birth: 11/05/1975

## 2018-08-28 DIAGNOSIS — M1711 Unilateral primary osteoarthritis, right knee: Secondary | ICD-10-CM | POA: Diagnosis not present

## 2018-08-28 DIAGNOSIS — S83411A Sprain of medial collateral ligament of right knee, initial encounter: Secondary | ICD-10-CM | POA: Diagnosis not present

## 2018-08-28 DIAGNOSIS — M25561 Pain in right knee: Secondary | ICD-10-CM | POA: Diagnosis not present

## 2018-09-14 DIAGNOSIS — Z20828 Contact with and (suspected) exposure to other viral communicable diseases: Secondary | ICD-10-CM | POA: Diagnosis not present

## 2018-09-14 DIAGNOSIS — M25561 Pain in right knee: Secondary | ICD-10-CM | POA: Diagnosis not present

## 2018-09-14 DIAGNOSIS — Z01812 Encounter for preprocedural laboratory examination: Secondary | ICD-10-CM | POA: Diagnosis not present

## 2018-09-18 DIAGNOSIS — Z7951 Long term (current) use of inhaled steroids: Secondary | ICD-10-CM | POA: Diagnosis not present

## 2018-09-18 DIAGNOSIS — G588 Other specified mononeuropathies: Secondary | ICD-10-CM | POA: Diagnosis not present

## 2018-09-18 DIAGNOSIS — Z880 Allergy status to penicillin: Secondary | ICD-10-CM | POA: Diagnosis not present

## 2018-09-18 DIAGNOSIS — G8929 Other chronic pain: Secondary | ICD-10-CM | POA: Diagnosis not present

## 2018-09-18 DIAGNOSIS — Z881 Allergy status to other antibiotic agents status: Secondary | ICD-10-CM | POA: Diagnosis not present

## 2018-09-18 DIAGNOSIS — Z791 Long term (current) use of non-steroidal anti-inflammatories (NSAID): Secondary | ICD-10-CM | POA: Diagnosis not present

## 2018-09-18 DIAGNOSIS — Z87892 Personal history of anaphylaxis: Secondary | ICD-10-CM | POA: Diagnosis not present

## 2018-09-18 DIAGNOSIS — Z885 Allergy status to narcotic agent status: Secondary | ICD-10-CM | POA: Diagnosis not present

## 2018-09-18 DIAGNOSIS — Z79899 Other long term (current) drug therapy: Secondary | ICD-10-CM | POA: Diagnosis not present

## 2018-09-18 DIAGNOSIS — M1711 Unilateral primary osteoarthritis, right knee: Secondary | ICD-10-CM | POA: Diagnosis not present

## 2018-09-18 DIAGNOSIS — Z888 Allergy status to other drugs, medicaments and biological substances status: Secondary | ICD-10-CM | POA: Diagnosis not present

## 2018-10-05 DIAGNOSIS — J45909 Unspecified asthma, uncomplicated: Secondary | ICD-10-CM | POA: Diagnosis not present

## 2018-10-05 DIAGNOSIS — Z79899 Other long term (current) drug therapy: Secondary | ICD-10-CM | POA: Diagnosis not present

## 2018-10-05 DIAGNOSIS — R51 Headache: Secondary | ICD-10-CM | POA: Diagnosis not present

## 2018-10-05 DIAGNOSIS — Z885 Allergy status to narcotic agent status: Secondary | ICD-10-CM | POA: Diagnosis not present

## 2018-10-05 DIAGNOSIS — R0602 Shortness of breath: Secondary | ICD-10-CM | POA: Diagnosis not present

## 2018-10-05 DIAGNOSIS — J45901 Unspecified asthma with (acute) exacerbation: Secondary | ICD-10-CM | POA: Diagnosis not present

## 2018-10-05 DIAGNOSIS — Z880 Allergy status to penicillin: Secondary | ICD-10-CM | POA: Diagnosis not present

## 2018-10-05 DIAGNOSIS — Z881 Allergy status to other antibiotic agents status: Secondary | ICD-10-CM | POA: Diagnosis not present

## 2018-10-05 DIAGNOSIS — R0989 Other specified symptoms and signs involving the circulatory and respiratory systems: Secondary | ICD-10-CM | POA: Diagnosis not present

## 2018-10-05 DIAGNOSIS — U071 COVID-19: Secondary | ICD-10-CM | POA: Diagnosis not present

## 2018-10-05 DIAGNOSIS — Z888 Allergy status to other drugs, medicaments and biological substances status: Secondary | ICD-10-CM | POA: Diagnosis not present

## 2018-10-13 DIAGNOSIS — J4540 Moderate persistent asthma, uncomplicated: Secondary | ICD-10-CM | POA: Diagnosis not present

## 2018-10-13 DIAGNOSIS — D72829 Elevated white blood cell count, unspecified: Secondary | ICD-10-CM | POA: Diagnosis not present

## 2018-10-13 DIAGNOSIS — R918 Other nonspecific abnormal finding of lung field: Secondary | ICD-10-CM | POA: Diagnosis not present

## 2018-10-13 DIAGNOSIS — M545 Low back pain: Secondary | ICD-10-CM | POA: Diagnosis not present

## 2018-10-13 DIAGNOSIS — R740 Nonspecific elevation of levels of transaminase and lactic acid dehydrogenase [LDH]: Secondary | ICD-10-CM | POA: Diagnosis not present

## 2018-10-13 DIAGNOSIS — J4541 Moderate persistent asthma with (acute) exacerbation: Secondary | ICD-10-CM | POA: Diagnosis not present

## 2018-10-13 DIAGNOSIS — I10 Essential (primary) hypertension: Secondary | ICD-10-CM | POA: Diagnosis not present

## 2018-10-13 DIAGNOSIS — E119 Type 2 diabetes mellitus without complications: Secondary | ICD-10-CM | POA: Diagnosis not present

## 2018-10-13 DIAGNOSIS — R0602 Shortness of breath: Secondary | ICD-10-CM | POA: Diagnosis not present

## 2018-10-13 DIAGNOSIS — U071 COVID-19: Secondary | ICD-10-CM | POA: Diagnosis not present

## 2018-10-13 DIAGNOSIS — J189 Pneumonia, unspecified organism: Secondary | ICD-10-CM | POA: Diagnosis not present

## 2018-10-13 DIAGNOSIS — R0902 Hypoxemia: Secondary | ICD-10-CM | POA: Diagnosis not present

## 2018-10-13 DIAGNOSIS — F411 Generalized anxiety disorder: Secondary | ICD-10-CM | POA: Diagnosis not present

## 2018-10-13 DIAGNOSIS — J9601 Acute respiratory failure with hypoxia: Secondary | ICD-10-CM | POA: Diagnosis not present

## 2018-10-13 DIAGNOSIS — Z23 Encounter for immunization: Secondary | ICD-10-CM | POA: Diagnosis not present

## 2018-10-13 DIAGNOSIS — J1289 Other viral pneumonia: Secondary | ICD-10-CM | POA: Diagnosis not present

## 2018-10-13 DIAGNOSIS — G43909 Migraine, unspecified, not intractable, without status migrainosus: Secondary | ICD-10-CM | POA: Diagnosis not present

## 2018-10-13 DIAGNOSIS — M1990 Unspecified osteoarthritis, unspecified site: Secondary | ICD-10-CM | POA: Diagnosis not present

## 2018-10-13 DIAGNOSIS — G8929 Other chronic pain: Secondary | ICD-10-CM | POA: Diagnosis not present

## 2018-10-30 DIAGNOSIS — J4540 Moderate persistent asthma, uncomplicated: Secondary | ICD-10-CM | POA: Diagnosis not present

## 2018-10-30 DIAGNOSIS — U071 COVID-19: Secondary | ICD-10-CM | POA: Diagnosis not present

## 2018-11-01 DIAGNOSIS — R000 Tachycardia, unspecified: Secondary | ICD-10-CM | POA: Diagnosis not present

## 2018-11-01 DIAGNOSIS — E119 Type 2 diabetes mellitus without complications: Secondary | ICD-10-CM | POA: Diagnosis not present

## 2018-11-01 DIAGNOSIS — U071 COVID-19: Secondary | ICD-10-CM | POA: Diagnosis not present

## 2018-11-23 DIAGNOSIS — J4540 Moderate persistent asthma, uncomplicated: Secondary | ICD-10-CM | POA: Diagnosis not present

## 2018-11-23 DIAGNOSIS — U071 COVID-19: Secondary | ICD-10-CM | POA: Diagnosis not present

## 2018-11-30 DIAGNOSIS — U071 COVID-19: Secondary | ICD-10-CM | POA: Diagnosis not present

## 2018-11-30 DIAGNOSIS — J4540 Moderate persistent asthma, uncomplicated: Secondary | ICD-10-CM | POA: Diagnosis not present

## 2018-12-04 DIAGNOSIS — S83411A Sprain of medial collateral ligament of right knee, initial encounter: Secondary | ICD-10-CM | POA: Diagnosis not present

## 2018-12-10 DIAGNOSIS — S83411A Sprain of medial collateral ligament of right knee, initial encounter: Secondary | ICD-10-CM | POA: Diagnosis not present

## 2018-12-24 DIAGNOSIS — U071 COVID-19: Secondary | ICD-10-CM | POA: Diagnosis not present

## 2018-12-24 DIAGNOSIS — J4540 Moderate persistent asthma, uncomplicated: Secondary | ICD-10-CM | POA: Diagnosis not present

## 2018-12-25 DIAGNOSIS — J4520 Mild intermittent asthma, uncomplicated: Secondary | ICD-10-CM | POA: Diagnosis not present

## 2018-12-25 DIAGNOSIS — U071 COVID-19: Secondary | ICD-10-CM | POA: Diagnosis not present

## 2018-12-25 DIAGNOSIS — I10 Essential (primary) hypertension: Secondary | ICD-10-CM | POA: Diagnosis not present

## 2018-12-25 DIAGNOSIS — E119 Type 2 diabetes mellitus without complications: Secondary | ICD-10-CM | POA: Diagnosis not present

## 2018-12-30 DIAGNOSIS — U071 COVID-19: Secondary | ICD-10-CM | POA: Diagnosis not present

## 2018-12-31 DIAGNOSIS — S83411A Sprain of medial collateral ligament of right knee, initial encounter: Secondary | ICD-10-CM | POA: Diagnosis not present

## 2018-12-31 DIAGNOSIS — R600 Localized edema: Secondary | ICD-10-CM | POA: Diagnosis not present

## 2019-01-07 DIAGNOSIS — M8788 Other osteonecrosis, other site: Secondary | ICD-10-CM | POA: Diagnosis not present

## 2019-01-10 DIAGNOSIS — M8788 Other osteonecrosis, other site: Secondary | ICD-10-CM | POA: Insufficient documentation

## 2019-01-23 DIAGNOSIS — U071 COVID-19: Secondary | ICD-10-CM | POA: Diagnosis not present

## 2019-01-24 DIAGNOSIS — M8788 Other osteonecrosis, other site: Secondary | ICD-10-CM | POA: Diagnosis not present

## 2019-01-24 DIAGNOSIS — S83411A Sprain of medial collateral ligament of right knee, initial encounter: Secondary | ICD-10-CM | POA: Diagnosis not present

## 2019-01-30 DIAGNOSIS — J4540 Moderate persistent asthma, uncomplicated: Secondary | ICD-10-CM | POA: Diagnosis not present

## 2019-01-30 DIAGNOSIS — U071 COVID-19: Secondary | ICD-10-CM | POA: Diagnosis not present

## 2019-02-04 DIAGNOSIS — Z5181 Encounter for therapeutic drug level monitoring: Secondary | ICD-10-CM | POA: Diagnosis not present

## 2019-02-04 DIAGNOSIS — G894 Chronic pain syndrome: Secondary | ICD-10-CM | POA: Diagnosis not present

## 2019-02-04 DIAGNOSIS — M8788 Other osteonecrosis, other site: Secondary | ICD-10-CM | POA: Diagnosis not present

## 2019-02-04 DIAGNOSIS — M1712 Unilateral primary osteoarthritis, left knee: Secondary | ICD-10-CM | POA: Diagnosis not present

## 2019-02-08 DIAGNOSIS — W19XXXA Unspecified fall, initial encounter: Secondary | ICD-10-CM | POA: Diagnosis not present

## 2019-02-08 DIAGNOSIS — Z79899 Other long term (current) drug therapy: Secondary | ICD-10-CM | POA: Diagnosis not present

## 2019-02-08 DIAGNOSIS — Z9049 Acquired absence of other specified parts of digestive tract: Secondary | ICD-10-CM | POA: Diagnosis not present

## 2019-02-08 DIAGNOSIS — Z888 Allergy status to other drugs, medicaments and biological substances status: Secondary | ICD-10-CM | POA: Diagnosis not present

## 2019-02-08 DIAGNOSIS — M25561 Pain in right knee: Secondary | ICD-10-CM | POA: Diagnosis not present

## 2019-02-08 DIAGNOSIS — Z5181 Encounter for therapeutic drug level monitoring: Secondary | ICD-10-CM | POA: Diagnosis not present

## 2019-02-08 DIAGNOSIS — Z9851 Tubal ligation status: Secondary | ICD-10-CM | POA: Diagnosis not present

## 2019-02-08 DIAGNOSIS — Z79891 Long term (current) use of opiate analgesic: Secondary | ICD-10-CM | POA: Diagnosis not present

## 2019-02-08 DIAGNOSIS — M1990 Unspecified osteoarthritis, unspecified site: Secondary | ICD-10-CM | POA: Diagnosis not present

## 2019-02-08 DIAGNOSIS — Z881 Allergy status to other antibiotic agents status: Secondary | ICD-10-CM | POA: Diagnosis not present

## 2019-02-08 DIAGNOSIS — Z885 Allergy status to narcotic agent status: Secondary | ICD-10-CM | POA: Diagnosis not present

## 2019-02-08 DIAGNOSIS — Z880 Allergy status to penicillin: Secondary | ICD-10-CM | POA: Diagnosis not present

## 2019-02-21 DIAGNOSIS — Z20828 Contact with and (suspected) exposure to other viral communicable diseases: Secondary | ICD-10-CM | POA: Diagnosis not present

## 2019-02-21 DIAGNOSIS — Z03818 Encounter for observation for suspected exposure to other biological agents ruled out: Secondary | ICD-10-CM | POA: Diagnosis not present

## 2019-02-23 DIAGNOSIS — J4540 Moderate persistent asthma, uncomplicated: Secondary | ICD-10-CM | POA: Diagnosis not present

## 2019-02-23 DIAGNOSIS — U071 COVID-19: Secondary | ICD-10-CM | POA: Diagnosis not present

## 2019-02-26 DIAGNOSIS — M25561 Pain in right knee: Secondary | ICD-10-CM | POA: Diagnosis not present

## 2019-02-26 DIAGNOSIS — M8788 Other osteonecrosis, other site: Secondary | ICD-10-CM | POA: Diagnosis not present

## 2019-02-28 DIAGNOSIS — Z23 Encounter for immunization: Secondary | ICD-10-CM | POA: Diagnosis not present

## 2019-03-02 DIAGNOSIS — U071 COVID-19: Secondary | ICD-10-CM | POA: Diagnosis not present

## 2019-03-02 DIAGNOSIS — J4540 Moderate persistent asthma, uncomplicated: Secondary | ICD-10-CM | POA: Diagnosis not present

## 2019-03-05 DIAGNOSIS — Z791 Long term (current) use of non-steroidal anti-inflammatories (NSAID): Secondary | ICD-10-CM | POA: Diagnosis not present

## 2019-03-05 DIAGNOSIS — M1712 Unilateral primary osteoarthritis, left knee: Secondary | ICD-10-CM | POA: Diagnosis not present

## 2019-03-05 DIAGNOSIS — M25562 Pain in left knee: Secondary | ICD-10-CM | POA: Diagnosis not present

## 2019-03-05 DIAGNOSIS — Z7951 Long term (current) use of inhaled steroids: Secondary | ICD-10-CM | POA: Diagnosis not present

## 2019-03-05 DIAGNOSIS — Z880 Allergy status to penicillin: Secondary | ICD-10-CM | POA: Diagnosis not present

## 2019-03-05 DIAGNOSIS — M792 Neuralgia and neuritis, unspecified: Secondary | ICD-10-CM | POA: Diagnosis not present

## 2019-03-05 DIAGNOSIS — Z793 Long term (current) use of hormonal contraceptives: Secondary | ICD-10-CM | POA: Diagnosis not present

## 2019-03-05 DIAGNOSIS — G8929 Other chronic pain: Secondary | ICD-10-CM | POA: Diagnosis not present

## 2019-03-05 DIAGNOSIS — Z79899 Other long term (current) drug therapy: Secondary | ICD-10-CM | POA: Diagnosis not present

## 2019-03-05 DIAGNOSIS — Z885 Allergy status to narcotic agent status: Secondary | ICD-10-CM | POA: Diagnosis not present

## 2019-03-05 DIAGNOSIS — M170 Bilateral primary osteoarthritis of knee: Secondary | ICD-10-CM | POA: Diagnosis not present

## 2019-03-05 DIAGNOSIS — Z881 Allergy status to other antibiotic agents status: Secondary | ICD-10-CM | POA: Diagnosis not present

## 2019-03-14 ENCOUNTER — Ambulatory Visit: Payer: BC Managed Care – PPO | Admitting: Physical Therapy

## 2019-03-19 ENCOUNTER — Ambulatory Visit: Payer: BC Managed Care – PPO | Attending: Sports Medicine | Admitting: Physical Therapy

## 2019-03-19 ENCOUNTER — Other Ambulatory Visit: Payer: Self-pay

## 2019-03-19 DIAGNOSIS — M25661 Stiffness of right knee, not elsewhere classified: Secondary | ICD-10-CM | POA: Diagnosis not present

## 2019-03-19 DIAGNOSIS — G8929 Other chronic pain: Secondary | ICD-10-CM | POA: Diagnosis not present

## 2019-03-19 DIAGNOSIS — M25561 Pain in right knee: Secondary | ICD-10-CM | POA: Diagnosis not present

## 2019-03-19 NOTE — Therapy (Signed)
[facility] [place], [place], [address] Phone: [phone]   Fax:  [phone]  Physical Therapy Evaluation  Patient Details  Name: Jamie Doyle MRN: [id number] Date of Birth: 01/27/1975 Referring Provider (PT): Jaynee Eagles MD   Encounter Date: 03/19/2019  PT End of Session - 03/19/19 1347    Visit Number  1    Number of Visits  12    Date for PT Re-Evaluation  04/30/19    PT Start Time  0100    PT Stop Time  0143    PT Time Calculation (min)  43 min    Activity Tolerance  Patient tolerated treatment well       No past medical history on file.  No past surgical history on file.  There were no vitals filed for this visit.   Subjective Assessment - 03/19/19 1330    Subjective  COVID-19 screen performed prior to patient entering clinic.  The patient returns to PT with continued c/o right knee pain.  She was last seen in July of 2020  for a RT MCL tear) and states her knee was doing much better.  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  She has been prescribed Fosamax and PT to regain motion and strength in her knee.  The possibility of a TKA has been discussed with her via her doctor.  Her pain-level today is a 6/10 but can rise to a 10/10 with increased movement, bending and standing.  She wears a knee brace for extra stability and a acne for additional safety.    Pertinent History  Asthma, HTN, Cholecystectomy, tubal ligation.    How long can you sit comfortably?  30 minutes.    How long can you stand comfortably?  <15 minutes.    How long can you walk comfortably?  Short community distances.    Diagnostic tests  MRI.    Patient Stated Goals  Get back to normal.    Pain Score  6     Pain Location  Knee    Pain Orientation  Right    Pain Descriptors / Indicators  Aching;Sharp    Pain Onset  More than a month ago         [facility] PT Assessment - 03/19/19 0001      Assessment   Medical Diagnosis  RT knee  pain.    Referring Provider (PT)  Jaynee Eagles MD    Onset Date/Surgical Date  --   05/25/2018     Precautions   Precaution Comments  --   C/o rightknee giving way.   Required Braces or Orthoses  --   RT knee brace.     Restrictions   Weight Bearing Restrictions  No      Balance Screen   Has the patient fallen in the past 6 months  Yes    How many times?  --   2.   Has the patient had a decrease in activity level because of a fear of falling?   Yes    Is the patient reluctant to leave their home because of a fear of falling?   No      Home Environment   Living Environment  Private residence      Prior Function   Level of Independence  Independent      Observation/Other Assessments   Observations  --   Minimal right knee edema.     AROM   AROM Assessment Site  Knee    Right/Left Knee  Right    Right Knee Extension  0    Right Knee Flexion  90      Strength   Overall Strength Comments  RT hip flexion/abd= 4/5 and right knee extension graded grossly at 4-/5 limited in part due to pain.      Palpation   Palpation comment  Tender to palpation over right medial joint line.      Ambulation/Gait   Gait Comments  Patient walking with antalgia and knee right knee held somewhat stiff.                Objective measurements completed on examination: See above findings.      [facility] Adult PT Treatment/Exercise - 03/19/19 0001      Modalities   Modalities  Electrical Stimulation;Vasopneumatic      Electrical Stimulation   Electrical Stimulation Location  Right medial knee.    Electrical Stimulation Action  Pre-mod. at 80-150 Hz x 15 minutes.      Vasopneumatic   Number Minutes Vasopneumatic   15 minutes    Vasopnuematic Location   --   Right knee.   Vasopneumatic Pressure  Low                  PT Long Term Goals - 03/19/19 1356      PT LONG TERM GOAL #1   Title  Independent with a HEP.    Baseline  No knowledge of appropriate ther ex.    Time   6    Period  Weeks    Status  New      PT LONG TERM GOAL #2   Title  Active knee flexion to 120-125 degrees+ so the patient can perform functional tasks and do so with pain not > 2-3/10.    Baseline  90 degrees.    Time  6    Period  Weeks    Status  New      PT LONG TERM GOAL #3   Title  Increase right knee strength to a solid 4+/5 to provide good stability for accomplishment of functional activities.    Baseline  4-/5.    Time  6    Period  Weeks    Status  New      PT LONG TERM GOAL #5   Title  Walk in clinic without deviation.    Baseline  Antalgic stiff-legged gait pattern.    Time  6    Period  Weeks    Status  New             Plan - 03/19/19 1349    Clinical Impression Statement  The patient presentsto OPPT with c/o right knee pain and giving way.  She states her knee remains unstable as confoirmed by a valgus test via her physician.  She is wearing a knee brace for additional stability.  Her knee flexion was limited to 90 degrees.  Her edema is minimal.  Her strength is decreased in her right hip and knee.  She uses a cane for safety as well.  She states she has been found to have AVN and may need a TKA in the future.  Her functional mobility is impaired due to pain and deficits.  Patient will benefit from skilled physical therapy intervention to address deficits and pain.    Personal Factors and Comorbidities  Comorbidity 1    Comorbidities  Asthma, HTN, Cholecystectomy, tubal ligation.    Examination-Activity  Limitations  Locomotion Level;Other    Examination-Participation Restrictions  Community Activity;Other    Stability/Clinical Decision Making  Evolving/Moderate complexity    Clinical Decision Making  Low    Rehab Potential  Good    PT Frequency  2x / week    PT Duration  6 weeks    PT Treatment/Interventions  ADLs/Self Care Home Management;Cryotherapy;Electrical Stimulation;Ultrasound;Moist Heat;Gait training;Stair training;Functional mobility  training;Therapeutic activities;Therapeutic exercise;Neuromuscular re-education;Manual techniques;Patient/family education;Passive range of motion;Vasopneumatic Device    PT Next Visit Plan  Nustep and progress to stationary bike.  Vaso and e'stim.  PRE's to right knee.    Consulted and Agree with Plan of Care  Patient       Patient will benefit from skilled therapeutic intervention in order to improve the following deficits and impairments:  Abnormal gait, Difficulty walking, Decreased activity tolerance, Decreased strength, Increased edema, Decreased range of motion, Pain  Visit Diagnosis: Chronic pain of right knee - Plan: PT plan of care cert/re-cert  Stiffness of right knee, not elsewhere classified - Plan: PT plan of care cert/re-cert     Problem List There are no problems to display for this patient.   Aashka Salomone, [place] MPT 03/19/2019, 2:00 PM  [facility] [address] [place], [place], [address] Phone: [phone]   Fax:  [phone]  Name: Jamie Doyle MRN: [id number] Date of Birth: 1975/10/23

## 2019-03-24 DIAGNOSIS — U071 COVID-19: Secondary | ICD-10-CM | POA: Diagnosis not present

## 2019-03-24 DIAGNOSIS — J4540 Moderate persistent asthma, uncomplicated: Secondary | ICD-10-CM | POA: Diagnosis not present

## 2019-03-25 DIAGNOSIS — J4520 Mild intermittent asthma, uncomplicated: Secondary | ICD-10-CM | POA: Diagnosis not present

## 2019-03-25 DIAGNOSIS — E119 Type 2 diabetes mellitus without complications: Secondary | ICD-10-CM | POA: Diagnosis not present

## 2019-03-25 DIAGNOSIS — U071 COVID-19: Secondary | ICD-10-CM | POA: Diagnosis not present

## 2019-03-25 DIAGNOSIS — Z6836 Body mass index (BMI) 36.0-36.9, adult: Secondary | ICD-10-CM | POA: Diagnosis not present

## 2019-03-25 DIAGNOSIS — I10 Essential (primary) hypertension: Secondary | ICD-10-CM | POA: Diagnosis not present

## 2019-03-25 DIAGNOSIS — Z0000 Encounter for general adult medical examination without abnormal findings: Secondary | ICD-10-CM | POA: Diagnosis not present

## 2019-03-27 ENCOUNTER — Encounter: Payer: Self-pay | Admitting: Physical Therapy

## 2019-03-27 ENCOUNTER — Other Ambulatory Visit: Payer: Self-pay

## 2019-03-27 ENCOUNTER — Ambulatory Visit: Payer: BC Managed Care – PPO | Attending: Sports Medicine | Admitting: Physical Therapy

## 2019-03-27 DIAGNOSIS — G8929 Other chronic pain: Secondary | ICD-10-CM | POA: Diagnosis not present

## 2019-03-27 DIAGNOSIS — M25561 Pain in right knee: Secondary | ICD-10-CM | POA: Insufficient documentation

## 2019-03-27 DIAGNOSIS — M25661 Stiffness of right knee, not elsewhere classified: Secondary | ICD-10-CM | POA: Diagnosis not present

## 2019-03-27 NOTE — Therapy (Signed)
[facility] [place], [place], [address] Phone: [phone]   Fax:  [phone]  Physical Therapy Treatment  Patient Details  Name: Jamie Doyle MRN: [id number] Date of Birth: August 08, 1975 Referring Provider (PT): Jaynee Eagles MD   Encounter Date: 03/27/2019  PT End of Session - 03/27/19 1201    Visit Number  2    Number of Visits  12    Date for PT Re-Evaluation  04/30/19    PT Start Time  3220    PT Stop Time  1207    PT Time Calculation (min)  51 min    Activity Tolerance  Patient tolerated treatment well    Behavior During Therapy  [facility] for tasks assessed/performed       History reviewed. No pertinent past medical history.  History reviewed. No pertinent surgical history.  There were no vitals filed for this visit.  Subjective Assessment - 03/27/19 1112    Subjective  COVID 19 screening performed on patient upon arrival. Uses a cane and brace per MD request. Reports she has some falls or LOB but can catch her balance. Report that she had to drive a lot yesterday and reports pain with driving for long period.    Pertinent History  Asthma, HTN, Cholecystectomy, tubal ligation.    How long can you sit comfortably?  30 minutes.    How long can you stand comfortably?  <15 minutes.    How long can you walk comfortably?  Short community distances.    Diagnostic tests  MRI.    Patient Stated Goals  Get back to normal.    Currently in Pain?  Yes    Pain Score  10-Worst pain ever    Pain Location  Knee    Pain Orientation  Right    Pain Descriptors / Indicators  Discomfort    Pain Type  Chronic pain    Pain Onset  More than a month ago    Pain Frequency  Constant                       [facility] Adult PT Treatment/Exercise - 03/27/19 0001      Exercises   Exercises  Knee/Hip      Knee/Hip Exercises: Aerobic   Nustep  L4 x15 min      Knee/Hip Exercises: Standing   Hip Abduction  AROM;Right;20 reps;Knee  straight    Forward Step Up  Right;2 sets;10 reps;Hand Hold: 2;Step Height: 6"      Knee/Hip Exercises: Supine   Short Arc Quad Sets  Strengthening;Right;20 reps    Hip Adduction Isometric  Strengthening;Both;20 reps    Other Supine Knee/Hip Exercises  B hip clam yellow theraband x20 reps      Modalities   Modalities  Vasopneumatic      Vasopneumatic   Number Minutes Vasopneumatic   15 minutes    Vasopnuematic Location   Knee    Vasopneumatic Pressure  Low    Vasopneumatic Temperature   34                  PT Long Term Goals - 03/19/19 1356      PT LONG TERM GOAL #1   Title  Independent with a HEP.    Baseline  No knowledge of appropriate ther ex.    Time  6    Period  Weeks    Status  New      PT LONG TERM GOAL #2  Title  Active knee flexion to 120-125 degrees+ so the patient can perform functional tasks and do so with pain not > 2-3/10.    Baseline  90 degrees.    Time  6    Period  Weeks    Status  New      PT LONG TERM GOAL #3   Title  Increase right knee strength to a solid 4+/5 to provide good stability for accomplishment of functional activities.    Baseline  4-/5.    Time  6    Period  Weeks    Status  New      PT LONG TERM GOAL #5   Title  Walk in clinic without deviation.    Baseline  Antalgic stiff-legged gait pattern.    Time  6    Period  Weeks    Status  New            Plan - 03/27/19 1212    Clinical Impression Statement  Patient presented in clinic with increased pain due to AVN of R knee. Patient still feels as if R knee is very unstable and is cautious due to falls. More weightbearing and OKC exercises completed today without complaint of pain by patient. Intermittant facial grimacing noted with therex. Normal vasopneumatic response noted following removal of the modality.    Personal Factors and Comorbidities  Comorbidity 1    Comorbidities  Asthma, HTN, Cholecystectomy, tubal ligation.    Examination-Activity Limitations   Locomotion Level;Other    Examination-Participation Restrictions  Community Activity;Other    Stability/Clinical Decision Making  Evolving/Moderate complexity    Rehab Potential  Good    PT Frequency  2x / week    PT Duration  6 weeks    PT Treatment/Interventions  ADLs/Self Care Home Management;Cryotherapy;Electrical Stimulation;Ultrasound;Moist Heat;Gait training;Stair training;Functional mobility training;Therapeutic activities;Therapeutic exercise;Neuromuscular re-education;Manual techniques;Patient/family education;Passive range of motion;Vasopneumatic Device    PT Next Visit Plan  Nustep and progress to stationary bike.  Vaso and e'stim.  PRE's to right knee.    Consulted and Agree with Plan of Care  Patient       Patient will benefit from skilled therapeutic intervention in order to improve the following deficits and impairments:  Abnormal gait, Difficulty walking, Decreased activity tolerance, Decreased strength, Increased edema, Decreased range of motion, Pain  Visit Diagnosis: Chronic pain of right knee  Stiffness of right knee, not elsewhere classified     Problem List There are no problems to display for this patient.   Marvell Fuller, PTA 03/27/2019, 12:15 PM  [facility] [address] [place], [place], [address] Phone: [phone]   Fax:  [phone]  Name: Torre Pikus MRN: [id number] Date of Birth: 1975-03-04

## 2019-03-28 ENCOUNTER — Ambulatory Visit: Payer: BC Managed Care – PPO | Admitting: Physical Therapy

## 2019-03-28 ENCOUNTER — Encounter: Payer: Self-pay | Admitting: Physical Therapy

## 2019-03-28 ENCOUNTER — Other Ambulatory Visit: Payer: Self-pay

## 2019-03-28 DIAGNOSIS — M25661 Stiffness of right knee, not elsewhere classified: Secondary | ICD-10-CM | POA: Diagnosis not present

## 2019-03-28 DIAGNOSIS — G8929 Other chronic pain: Secondary | ICD-10-CM | POA: Diagnosis not present

## 2019-03-28 DIAGNOSIS — M25561 Pain in right knee: Secondary | ICD-10-CM | POA: Diagnosis not present

## 2019-03-28 NOTE — Therapy (Signed)
Jamie Doyle [place], [place], [address] Phone: [phone]   Fax:  [phone]  Physical Therapy Treatment  Patient Details  Name: Jamie Doyle MRN: [id number] Date of Birth: 08-26-1975 Referring Provider (PT): Jaynee Eagles MD   Encounter Date: 03/28/2019  PT End of Session - 03/28/19 0911    Visit Number  3    Number of Visits  12    Date for PT Re-Evaluation  04/30/19    PT Start Time  0908    PT Stop Time  0954    PT Time Calculation (min)  46 min    Activity Tolerance  Patient tolerated treatment well    Behavior During Therapy  [facility] for tasks assessed/performed       History reviewed. No pertinent past medical history.  History reviewed. No pertinent surgical history.  There were no vitals filed for this visit.  Subjective Assessment - 03/28/19 0859    Subjective  COVID 19 screening performed on patient upon arrival. Patient reports some improvement with pain but did take a pain pill last night. Has a exercise bike at home.    Pertinent History  Asthma, HTN, Cholecystectomy, tubal ligation.    How long can you sit comfortably?  30 minutes.    How long can you stand comfortably?  <15 minutes.    How long can you walk comfortably?  Short community distances.    Diagnostic tests  MRI.    Patient Stated Goals  Get back to normal.    Currently in Pain?  Yes    Pain Score  5     Pain Location  Knee    Pain Orientation  Right    Pain Descriptors / Indicators  Discomfort    Pain Type  Chronic pain    Pain Onset  More than a month ago    Pain Frequency  Constant         [facility] PT Assessment - 03/28/19 0001      Assessment   Medical Diagnosis  RT knee pain.    Referring Provider (PT)  Jaynee Eagles MD    Onset Date/Surgical Date  05/25/18    Next MD Visit  04/23/2019      Restrictions   Weight Bearing Restrictions  No                   [facility] Adult PT Treatment/Exercise - 03/28/19 0001      Knee/Hip Exercises: Aerobic   Nustep  L4 x12 min      Knee/Hip Exercises: Standing   Hip Abduction  AROM;Right;20 reps;Knee straight    Forward Step Up  Right;2 sets;10 reps;Hand Hold: 2;Step Height: 4"    Rocker Board  3 minutes      Knee/Hip Exercises: Supine   Short Arc Quad Sets  Strengthening;Right;20 reps      Modalities   Modalities  Vasopneumatic      Vasopneumatic   Number Minutes Vasopneumatic   15 minutes    Vasopnuematic Location   Knee    Vasopneumatic Pressure  Low    Vasopneumatic Temperature   34                  PT Long Term Goals - 03/19/19 1356      PT LONG TERM GOAL #1   Title  Independent with a HEP.    Baseline  No knowledge of appropriate ther ex.    Time  6    Period  Weeks    Status  New      PT LONG TERM GOAL #2   Title  Active knee flexion to 120-125 degrees+ so the patient can perform functional tasks and do so with pain not > 2-3/10.    Baseline  90 degrees.    Time  6    Period  Weeks    Status  New      PT LONG TERM GOAL #3   Title  Increase right knee strength to a solid 4+/5 to provide good stability for accomplishment of functional activities.    Baseline  4-/5.    Time  6    Period  Weeks    Status  New      PT LONG TERM GOAL #5   Title  Walk in clinic without deviation.    Baseline  Antalgic stiff-legged gait pattern.    Time  6    Period  Weeks    Status  New            Plan - 03/28/19 1002    Clinical Impression Statement  Patient presented in clinic with reports of less pain over of R knee. Patient progressed to more mobility and strengthening exercises at low level to avoid excessive pain. More soreness reported in R quad with SAQ today per patient report. Patient still very cautious due to knee buckling and reports feeling as if bone structure is shifting in R knee. Normal vasopneumatic response noted following removal of the modalities.    Personal Factors and Comorbidities  Comorbidity 1     Comorbidities  Asthma, HTN, Cholecystectomy, tubal ligation.    Examination-Activity Limitations  Locomotion Level;Other    Examination-Participation Restrictions  Community Activity;Other    Stability/Clinical Decision Making  Evolving/Moderate complexity    Rehab Potential  Good    PT Frequency  2x / week    PT Duration  6 weeks    PT Treatment/Interventions  ADLs/Self Care Home Management;Cryotherapy;Electrical Stimulation;Ultrasound;Moist Heat;Gait training;Stair training;Functional mobility training;Therapeutic activities;Therapeutic exercise;Neuromuscular re-education;Manual techniques;Patient/family education;Passive range of motion;Vasopneumatic Device    PT Next Visit Plan  Nustep and progress to stationary bike.  Vaso and e'stim.  PRE's to right knee.    Consulted and Agree with Plan of Care  Patient       Patient will benefit from skilled therapeutic intervention in order to improve the following deficits and impairments:  Abnormal gait, Difficulty walking, Decreased activity tolerance, Decreased strength, Increased edema, Decreased range of motion, Pain  Visit Diagnosis: Chronic pain of right knee  Stiffness of right knee, not elsewhere classified     Problem List There are no problems to display for this patient.   Jamie Doyle, PTA 03/28/2019, 10:06 AM  [facility] [address] [place], [place], [address] Phone: [phone]   Fax:  [phone]  Name: Jamie Doyle MRN: [id number] Date of Birth: 1975-07-18

## 2019-03-29 DIAGNOSIS — Z23 Encounter for immunization: Secondary | ICD-10-CM | POA: Diagnosis not present

## 2019-03-30 DIAGNOSIS — U071 COVID-19: Secondary | ICD-10-CM | POA: Diagnosis not present

## 2019-03-30 DIAGNOSIS — J4540 Moderate persistent asthma, uncomplicated: Secondary | ICD-10-CM | POA: Diagnosis not present

## 2019-04-01 DIAGNOSIS — G894 Chronic pain syndrome: Secondary | ICD-10-CM | POA: Diagnosis not present

## 2019-04-01 DIAGNOSIS — M8788 Other osteonecrosis, other site: Secondary | ICD-10-CM | POA: Diagnosis not present

## 2019-04-01 DIAGNOSIS — M1712 Unilateral primary osteoarthritis, left knee: Secondary | ICD-10-CM | POA: Diagnosis not present

## 2019-04-01 DIAGNOSIS — S83411S Sprain of medial collateral ligament of right knee, sequela: Secondary | ICD-10-CM | POA: Diagnosis not present

## 2019-04-08 ENCOUNTER — Ambulatory Visit: Payer: BC Managed Care – PPO | Admitting: Physical Therapy

## 2019-04-08 ENCOUNTER — Encounter: Payer: Self-pay | Admitting: Physical Therapy

## 2019-04-08 ENCOUNTER — Other Ambulatory Visit: Payer: Self-pay

## 2019-04-08 DIAGNOSIS — M25661 Stiffness of right knee, not elsewhere classified: Secondary | ICD-10-CM

## 2019-04-08 DIAGNOSIS — M25561 Pain in right knee: Secondary | ICD-10-CM | POA: Diagnosis not present

## 2019-04-08 DIAGNOSIS — G8929 Other chronic pain: Secondary | ICD-10-CM | POA: Diagnosis not present

## 2019-04-08 NOTE — Therapy (Signed)
[facility] [address] [place], [place], [address] Phone: [phone]   Fax:  [phone]  Physical Therapy Treatment  Patient Details  Name: Jamie Doyle MRN: [id number] Date of Birth: 05-Jan-1976 Referring Provider (PT): Darnell Level MD   Encounter Date: 04/08/2019  PT End of Session - 04/08/19 1132    Visit Number  4    Number of Visits  12    Date for PT Re-Evaluation  04/30/19    PT Start Time  1116    PT Stop Time  1209    PT Time Calculation (min)  53 min    Activity Tolerance  Patient tolerated treatment well    Behavior During Therapy  [facility] for tasks assessed/performed       History reviewed. No pertinent past medical history.  History reviewed. No pertinent surgical history.  There were no vitals filed for this visit.  Subjective Assessment - 04/08/19 1130    Subjective  COVID 19 screening performed on patient upon arrival. Patient reports going on a trip over the past week and had a lot more swelling. Reports she used her brace.    Pertinent History  Asthma, HTN, Cholecystectomy, tubal ligation.    How long can you sit comfortably?  30 minutes.    How long can you stand comfortably?  <15 minutes.    How long can you walk comfortably?  Short community distances.    Diagnostic tests  MRI.    Patient Stated Goals  Get back to normal.    Currently in Pain?  Other (Comment)   Pain assessment not provided by patient        [facility] PT Assessment - 04/08/19 0001      Assessment   Medical Diagnosis  RT knee pain.    Referring Provider (PT)  Darnell Level MD    Onset Date/Surgical Date  05/25/18    Next MD Visit  04/23/2019      Restrictions   Weight Bearing Restrictions  No      ROM / Strength   AROM / PROM / Strength  AROM;Strength      AROM   Overall AROM   Deficits    AROM Assessment Site  Knee    Right/Left Knee  Right    Right Knee Extension  0    Right Knee Flexion  70      Strength   Overall Strength   Deficits    Strength Assessment Site  Knee    Right/Left Knee  Right    Right Knee Flexion  3/5    Right Knee Extension  3/5                   [facility] Adult PT Treatment/Exercise - 04/08/19 0001      Knee/Hip Exercises: Aerobic   Nustep  L4 x17 min      Knee/Hip Exercises: Standing   Terminal Knee Extension  Strengthening;Right;15 reps;Theraband    Theraband Level (Terminal Knee Extension)  Level 3 (Green)    Hip Abduction  AROM;Right;20 reps;Knee straight    Forward Step Up  Right;2 sets;10 reps;Hand Hold: 2;Step Height: 4"    Rocker Board  3 minutes      Knee/Hip Exercises: Supine   Short Arc Quad Sets  Strengthening;Right;20 reps;Limitations    Water quality scientist Limitations  4#      Modalities   Modalities  Vasopneumatic      Vasopneumatic   Number Minutes Vasopneumatic   15  minutes    Vasopnuematic Location   Knee    Vasopneumatic Pressure  Low    Vasopneumatic Temperature   34                  PT Long Term Goals - 04/08/19 1204      PT LONG TERM GOAL #1   Title  Independent with a HEP.    Baseline  No knowledge of appropriate ther ex.    Time  6    Period  Weeks    Status  On-going      PT LONG TERM GOAL #2   Title  Active knee flexion to 120-125 degrees+ so the patient can perform functional tasks and do so with pain not > 2-3/10.    Baseline  90 degrees.    Time  6    Period  Weeks    Status  On-going      PT LONG TERM GOAL #3   Title  Increase right knee strength to a solid 4+/5 to provide good stability for accomplishment of functional activities.    Baseline  4-/5.    Time  6    Period  Weeks    Status  On-going      PT LONG TERM GOAL #5   Title  Walk in clinic without deviation.    Baseline  Antalgic stiff-legged gait pattern.    Time  6    Period  Weeks    Status  On-going            Plan - 04/08/19 1218    Clinical Impression Statement  Patient presented in clinic with reports of increased swelling after going  on vacation last week and doing a lot more walking. Patient able to tolerate light therex although patient reporting feeling a shifting with TKE. Patient limited with R knee flexion even more than at evaluation and with MMT assessment. Normal vasopneumatic response noted following removal of the modality.    Personal Factors and Comorbidities  Comorbidity 1    Comorbidities  Asthma, HTN, Cholecystectomy, tubal ligation.    Examination-Activity Limitations  Locomotion Level;Other    Examination-Participation Restrictions  Community Activity;Other    Stability/Clinical Decision Making  Evolving/Moderate complexity    Rehab Potential  Good    PT Frequency  2x / week    PT Duration  6 weeks    PT Treatment/Interventions  ADLs/Self Care Home Management;Cryotherapy;Electrical Stimulation;Ultrasound;Moist Heat;Gait training;Stair training;Functional mobility training;Therapeutic activities;Therapeutic exercise;Neuromuscular re-education;Manual techniques;Patient/family education;Passive range of motion;Vasopneumatic Device    PT Next Visit Plan  Nustep and progress to stationary bike.  Vaso and e'stim.  PRE's to right knee.    Consulted and Agree with Plan of Care  Patient       Patient will benefit from skilled therapeutic intervention in order to improve the following deficits and impairments:  Abnormal gait, Difficulty walking, Decreased activity tolerance, Decreased strength, Increased edema, Decreased range of motion, Pain  Visit Diagnosis: Chronic pain of right knee  Stiffness of right knee, not elsewhere classified     Problem List There are no problems to display for this patient.   Standley Brooking, PTA 04/08/2019, 12:26 PM  [facility] [address] [place], [place], [address] Phone: [phone]   Fax:  [phone]  Name: Jamie Doyle MRN: [id number] Date of Birth: 03/04/1975

## 2019-04-15 ENCOUNTER — Ambulatory Visit: Payer: BC Managed Care – PPO | Admitting: Physical Therapy

## 2019-04-18 ENCOUNTER — Other Ambulatory Visit: Payer: Self-pay

## 2019-04-18 ENCOUNTER — Ambulatory Visit: Payer: BC Managed Care – PPO | Admitting: Physical Therapy

## 2019-04-18 DIAGNOSIS — M25661 Stiffness of right knee, not elsewhere classified: Secondary | ICD-10-CM

## 2019-04-18 DIAGNOSIS — G8929 Other chronic pain: Secondary | ICD-10-CM

## 2019-04-18 DIAGNOSIS — M25561 Pain in right knee: Secondary | ICD-10-CM

## 2019-04-18 NOTE — Therapy (Signed)
[facility] [place], [place], [address] Phone: [phone]   Fax:  [phone]  Physical Therapy Treatment  Patient Details  Name: Jamie Doyle MRN: [id number] Date of Birth: [birth year] Referring Provider (PT): Jaynee Eagles MD   Encounter Date: 04/18/2019  PT End of Session - 04/18/19 1044    Visit Number  5    Number of Visits  12    Date for PT Re-Evaluation  04/30/19    PT Start Time  0904    PT Stop Time  1000    PT Time Calculation (min)  56 min    Activity Tolerance  Patient tolerated treatment well    Behavior During Therapy  [facility] for tasks assessed/performed       No past medical history on file.  No past surgical history on file.  There were no vitals filed for this visit.  Subjective Assessment - 04/18/19 0953    Subjective  COVID-19 screen performed prior to patient entering clinic.  Knee still bothering me.  I really think I need a replacement.    Pertinent History  Asthma, HTN, Cholecystectomy, tubal ligation.    How long can you sit comfortably?  30 minutes.    How long can you stand comfortably?  <15 minutes.    How long can you walk comfortably?  Short community distances.    Diagnostic tests  MRI.    Patient Stated Goals  Get back to normal.    Currently in Pain?  Yes    Pain Score  5     Pain Location  Knee    Pain Orientation  Right    Pain Descriptors / Indicators  Discomfort    Pain Type  Chronic pain    Pain Onset  More than a month ago         [facility] PT Assessment - 04/18/19 0001      AROM   Right Knee Flexion  --   Gentle flexion to 103 degrees.                  [facility] Adult PT Treatment/Exercise - 04/18/19 0001      Exercises   Exercises  Knee/Hip      Knee/Hip Exercises: Aerobic   Nustep  Level 4 x 16 minutes.      Modalities   Modalities  Electrical Stimulation;Vasopneumatic      Acupuncturist Location  RT medial knee.    Electrical Stimulation Action  Pre-mod.    Electrical Stimulation Parameters  80-150 Hz x 20 minutes.      Vasopneumatic   Number Minutes Vasopneumatic   20 minutes    Vasopnuematic Location   --   Right knee.   Vasopneumatic Pressure  Low      Manual Therapy   Manual Therapy  Passive ROM    Manual therapy comments  In supine:  Gentle PROM into right knee range of motion x 7 minutes.                  PT Long Term Goals - 04/08/19 1204      PT LONG TERM GOAL #1   Title  Independent with a HEP.    Baseline  No knowledge of appropriate ther ex.    Time  6    Period  Weeks    Status  On-going      PT LONG TERM GOAL #2   Title  Active knee flexion  to 120-125 degrees+ so the patient can perform functional tasks and do so with pain not > 2-3/10.    Baseline  90 degrees.    Time  6    Period  Weeks    Status  On-going      PT LONG TERM GOAL #3   Title  Increase right knee strength to a solid 4+/5 to provide good stability for accomplishment of functional activities.    Baseline  4-/5.    Time  6    Period  Weeks    Status  On-going      PT LONG TERM GOAL #5   Title  Walk in clinic without deviation.    Baseline  Antalgic stiff-legged gait pattern.    Time  6    Period  Weeks    Status  On-going            Plan - 04/18/19 1034    Clinical Impression Statement  Patient still has significant right knee genu valgum.  She was able to achieve 103 degrees of right knee flexion in supine with gentle range of motion today.  She was not wearing her brace today, stating it makes her knee swell.    Personal Factors and Comorbidities  Comorbidity 1    Comorbidities  Asthma, HTN, Cholecystectomy, tubal ligation.    Examination-Activity Limitations  Locomotion Level;Other    Examination-Participation Restrictions  Community Activity;Other    Rehab Potential  Good    PT Frequency  2x / week    PT Duration  6 weeks    PT Treatment/Interventions  ADLs/Self Care Home  Management;Cryotherapy;Electrical Stimulation;Ultrasound;Moist Heat;Gait training;Stair training;Functional mobility training;Therapeutic activities;Therapeutic exercise;Neuromuscular re-education;Manual techniques;Patient/family education;Passive range of motion;Vasopneumatic Device    PT Next Visit Plan  Nustep and progress to stationary bike.  Vaso and e'stim.  PRE's to right knee.       Patient will benefit from skilled therapeutic intervention in order to improve the following deficits and impairments:  Abnormal gait, Difficulty walking, Decreased activity tolerance, Decreased strength, Increased edema, Decreased range of motion, Pain  Visit Diagnosis: Chronic pain of right knee  Stiffness of right knee, not elsewhere classified     Problem List There are no problems to display for this patient.   Andilynn Delavega, [place] MPT 04/18/2019, 10:46 AM  [facility] [address] [place], [place], [address] Phone: [phone]   Fax:  [phone]  Name: Jamie Doyle MRN: [id number] Date of Birth: [birth year]

## 2019-04-23 DIAGNOSIS — U071 COVID-19: Secondary | ICD-10-CM | POA: Diagnosis not present

## 2019-04-23 DIAGNOSIS — J4540 Moderate persistent asthma, uncomplicated: Secondary | ICD-10-CM | POA: Diagnosis not present

## 2019-04-23 DIAGNOSIS — M8788 Other osteonecrosis, other site: Secondary | ICD-10-CM | POA: Diagnosis not present

## 2019-04-23 DIAGNOSIS — M25561 Pain in right knee: Secondary | ICD-10-CM | POA: Diagnosis not present

## 2019-04-23 DIAGNOSIS — S83411A Sprain of medial collateral ligament of right knee, initial encounter: Secondary | ICD-10-CM | POA: Diagnosis not present

## 2019-04-30 DIAGNOSIS — J4540 Moderate persistent asthma, uncomplicated: Secondary | ICD-10-CM | POA: Diagnosis not present

## 2019-04-30 DIAGNOSIS — U071 COVID-19: Secondary | ICD-10-CM | POA: Diagnosis not present

## 2019-05-01 ENCOUNTER — Ambulatory Visit: Payer: BC Managed Care – PPO | Admitting: Physical Therapy

## 2019-05-03 ENCOUNTER — Ambulatory Visit: Payer: BC Managed Care – PPO | Admitting: *Deleted

## 2019-05-06 DIAGNOSIS — J40 Bronchitis, not specified as acute or chronic: Secondary | ICD-10-CM | POA: Diagnosis not present

## 2019-05-06 DIAGNOSIS — Z6836 Body mass index (BMI) 36.0-36.9, adult: Secondary | ICD-10-CM | POA: Diagnosis not present

## 2019-05-08 ENCOUNTER — Encounter: Payer: BC Managed Care – PPO | Admitting: Physical Therapy

## 2019-05-10 ENCOUNTER — Encounter: Payer: Self-pay | Admitting: Physical Therapy

## 2019-05-10 ENCOUNTER — Ambulatory Visit: Payer: BC Managed Care – PPO | Attending: Sports Medicine | Admitting: Physical Therapy

## 2019-05-10 ENCOUNTER — Other Ambulatory Visit: Payer: Self-pay

## 2019-05-10 DIAGNOSIS — M6281 Muscle weakness (generalized): Secondary | ICD-10-CM | POA: Diagnosis not present

## 2019-05-10 DIAGNOSIS — G8929 Other chronic pain: Secondary | ICD-10-CM | POA: Diagnosis not present

## 2019-05-10 DIAGNOSIS — M25561 Pain in right knee: Secondary | ICD-10-CM | POA: Diagnosis not present

## 2019-05-10 DIAGNOSIS — M25661 Stiffness of right knee, not elsewhere classified: Secondary | ICD-10-CM | POA: Diagnosis not present

## 2019-05-10 NOTE — Therapy (Signed)
Jamie Doyle [address] [place], [place], [address] Phone: [phone]   Fax:  [phone]  Physical Therapy Treatment  Patient Details  Name: Jamie Doyle MRN: [id number] Date of Birth: Mar 13, 1975 Referring Provider (PT): Darnell Level MD   Encounter Date: 05/10/2019  PT End of Session - 05/10/19 1129    Visit Number  6    Number of Visits  12    Date for PT Re-Evaluation  04/30/19    PT Start Time  1031    PT Stop Time  1126    PT Time Calculation (min)  55 min    Activity Tolerance  Patient tolerated treatment well    Behavior During Therapy  Jamie Doyle for tasks assessed/performed       History reviewed. No pertinent past medical history.  History reviewed. No pertinent surgical history.  There were no vitals filed for this visit.  Subjective Assessment - 05/10/19 1032    Subjective  COVID-19 screening performed upon arrival. Reports seeing the doctor; they ordered a custom brace which will arrive in about 2 weeks. Patient to continue PT for stretngthening and will reasses with MD in about 6 weeks.    Pertinent History  Asthma, HTN, Cholecystectomy, tubal ligation.    How long can you sit comfortably?  30 minutes.    How long can you stand comfortably?  <15 minutes.    How long can you walk comfortably?  Short community distances.    Diagnostic tests  MRI.    Patient Stated Goals  Get back to normal.    Currently in Pain?  Yes    Pain Score  5     Pain Location  Knee    Pain Orientation  Right    Pain Descriptors / Indicators  Discomfort    Pain Type  Chronic pain    Pain Onset  More than a month ago         Jamie Doyle PT Assessment - 05/10/19 0001      Assessment   Medical Diagnosis  RT knee pain.    Referring Provider (PT)  Darnell Level MD    Onset Date/Surgical Date  05/25/18    Next MD Visit  6 weeks                   Jamie Doyle Adult PT Treatment/Exercise - 05/10/19 0001      Exercises   Exercises  Knee/Hip       Knee/Hip Exercises: Aerobic   Nustep  Level 3 x 16 minutes.      Knee/Hip Exercises: Standing   Knee Flexion  AROM;Right;2 sets;10 reps    Hip Abduction  AROM;20 reps;Knee straight;Both    Rocker Board  3 minutes      Knee/Hip Exercises: Supine   Short Arc Quad Sets  Strengthening;Right;20 reps;Limitations    Heel Slides  AROM;Right;2 sets;10 reps      Modalities   Modalities  Programmer, applications Location  RT medial knee.    Electrical Stimulation Action  pre-mod    Electrical Stimulation Parameters  80-150 hz x15 mins    Electrical Stimulation Goals  Pain      Vasopneumatic   Number Minutes Vasopneumatic   15 minutes    Vasopnuematic Location   Knee    Vasopneumatic Pressure  Low    Vasopneumatic Temperature   34  PT Long Term Goals - 04/08/19 1204      PT LONG TERM GOAL #1   Title  Independent with a HEP.    Baseline  No knowledge of appropriate ther ex.    Time  6    Period  Weeks    Status  On-going      PT LONG TERM GOAL #2   Title  Active knee flexion to 120-125 degrees+ so the patient can perform functional tasks and do so with pain not > 2-3/10.    Baseline  90 degrees.    Time  6    Period  Weeks    Status  On-going      PT LONG TERM GOAL #3   Title  Increase right knee strength to a solid 4+/5 to provide good stability for accomplishment of functional activities.    Baseline  4-/5.    Time  6    Period  Weeks    Status  On-going      PT LONG TERM GOAL #5   Title  Walk in clinic without deviation.    Baseline  Antalgic stiff-legged gait pattern.    Time  6    Period  Weeks    Status  On-going            Plan - 05/10/19 1234    Clinical Impression Statement  Patient responded fairly with increasing pain with right knee SLS with UE support during left hip ABD exercise. Patient noted improvements since start of therapy with range and strength  but still reports ongoing "shifting". Normal response to modalities upon removal.    Personal Factors and Comorbidities  Comorbidity 1    Comorbidities  Asthma, HTN, Cholecystectomy, tubal ligation.    Examination-Activity Limitations  Locomotion Level;Other    Examination-Participation Restrictions  Community Activity;Other    Stability/Clinical Decision Making  Evolving/Moderate complexity    Clinical Decision Making  Low    Rehab Potential  Good    PT Frequency  2x / week    PT Duration  6 weeks    PT Treatment/Interventions  ADLs/Self Care Home Management;Cryotherapy;Electrical Stimulation;Ultrasound;Moist Heat;Gait training;Stair training;Functional mobility training;Therapeutic activities;Therapeutic exercise;Neuromuscular re-education;Manual techniques;Patient/family education;Passive range of motion;Vasopneumatic Device    PT Next Visit Plan  Nustep and progress to stationary bike.  Vaso and e'stim.  PRE's to right knee.    Consulted and Agree with Plan of Care  Patient       Patient will benefit from skilled therapeutic intervention in order to improve the following deficits and impairments:  Abnormal gait, Difficulty walking, Decreased activity tolerance, Decreased strength, Increased edema, Decreased range of motion, Pain  Visit Diagnosis: Chronic pain of right knee  Stiffness of right knee, not elsewhere classified     Problem List There are no problems to display for this patient.   Jamie Doyle, PT, DPT 05/10/2019, 12:40 PM  [facility] [place], [place], [address] Phone: [phone]   Fax:  [phone]  Name: Jamie Doyle MRN: [id number] Date of Birth: [birth year]

## 2019-05-13 ENCOUNTER — Ambulatory Visit: Payer: BC Managed Care – PPO | Admitting: Physical Therapy

## 2019-05-13 ENCOUNTER — Encounter: Payer: Self-pay | Admitting: Physical Therapy

## 2019-05-13 ENCOUNTER — Other Ambulatory Visit: Payer: Self-pay

## 2019-05-13 DIAGNOSIS — G8929 Other chronic pain: Secondary | ICD-10-CM

## 2019-05-13 DIAGNOSIS — M6281 Muscle weakness (generalized): Secondary | ICD-10-CM | POA: Diagnosis not present

## 2019-05-13 DIAGNOSIS — M25561 Pain in right knee: Secondary | ICD-10-CM | POA: Diagnosis not present

## 2019-05-13 DIAGNOSIS — M25661 Stiffness of right knee, not elsewhere classified: Secondary | ICD-10-CM

## 2019-05-13 NOTE — Therapy (Signed)
[facility] [address] [place], [place], [address] Phone: [phone]   Fax:  [phone]  Physical Therapy Treatment  Patient Details  Name: Jamie Doyle MRN: [id number] Date of Birth: 12-25-1975 Referring Provider (PT): Darnell Level MD   Encounter Date: 05/13/2019  PT End of Session - 05/13/19 0913    Visit Number  7    Number of Visits  12    Date for PT Re-Evaluation  04/30/19    PT Start Time  0900    PT Stop Time  0945    PT Time Calculation (min)  45 min    Activity Tolerance  Patient tolerated treatment well    Behavior During Therapy  [facility] for tasks assessed/performed       History reviewed. No pertinent past medical history.  History reviewed. No pertinent surgical history.  There were no vitals filed for this visit.  Subjective Assessment - 05/13/19 0912    Subjective  COVID 19 screening performed on patient upon arrival. Patient reports that she is to get a custom DonJoy brace. Reports two near falls today due to R knee weakness and buckling.    Pertinent History  Asthma, HTN, Cholecystectomy, tubal ligation.    How long can you sit comfortably?  30 minutes.    How long can you stand comfortably?  <15 minutes.    How long can you walk comfortably?  Short community distances.    Diagnostic tests  MRI.    Patient Stated Goals  Get back to normal.    Currently in Pain?  Yes    Pain Score  5     Pain Location  Knee    Pain Orientation  Right    Pain Descriptors / Indicators  Discomfort    Pain Type  Chronic pain    Pain Onset  More than a month ago    Pain Frequency  Constant         [facility] PT Assessment - 05/13/19 0001      Assessment   Medical Diagnosis  RT knee pain.    Referring Provider (PT)  Darnell Level MD    Onset Date/Surgical Date  05/25/18    Next MD Visit  After wearing brace for 4 weeks                   [facility] Adult PT Treatment/Exercise - 05/13/19 0001      Knee/Hip Exercises:  Aerobic   Nustep  L5 x16 min      Knee/Hip Exercises: Standing   Hip Abduction  AROM;Right;20 reps;Knee straight    Forward Step Up  Right;20 reps;Hand Hold: 2;Step Height: 6"      Knee/Hip Exercises: Seated   Long Arc Quad  Strengthening;Right;2 sets;10 reps;Weights    Long Arc Quad Weight  4 lbs.    Long [employer] Limitations  with ball squeeze      Knee/Hip Exercises: Supine   Straight Leg Raises  AROM;Right;15 reps      Modalities   Modalities  Surveyor, minerals Action  Pre-Mod    Electrical Stimulation Parameters  80-150 hz x10 min    Electrical Stimulation Goals  Pain      Vasopneumatic   Number Minutes Vasopneumatic   10 minutes    Vasopnuematic Location   Knee    Vasopneumatic Pressure  Low    Vasopneumatic Temperature  34                  PT Long Term Goals - 04/08/19 1204      PT LONG TERM GOAL #1   Title  Independent with a HEP.    Baseline  No knowledge of appropriate ther ex.    Time  6    Period  Weeks    Status  On-going      PT LONG TERM GOAL #2   Title  Active knee flexion to 120-125 degrees+ so the patient can perform functional tasks and do so with pain not > 2-3/10.    Baseline  90 degrees.    Time  6    Period  Weeks    Status  On-going      PT LONG TERM GOAL #3   Title  Increase right knee strength to a solid 4+/5 to provide good stability for accomplishment of functional activities.    Baseline  4-/5.    Time  6    Period  Weeks    Status  On-going      PT LONG TERM GOAL #5   Title  Walk in clinic without deviation.    Baseline  Antalgic stiff-legged gait pattern.    Time  6    Period  Weeks    Status  On-going            Plan - 05/13/19 1005    Clinical Impression Statement  Patient presented in clinic with mid level R knee pain but has not recieved new custom brace yet. Patient progressed through  light therex today with VCs for slow pace to avoid injury or safety risks. UE required during standing therex. Weakness also noted during SLR. Normal modalities response noted following removal of the modalities.    Personal Factors and Comorbidities  Comorbidity 1    Comorbidities  Asthma, HTN, Cholecystectomy, tubal ligation.    Examination-Activity Limitations  Locomotion Level;Other    Examination-Participation Restrictions  Community Activity;Other    Stability/Clinical Decision Making  Evolving/Moderate complexity    Rehab Potential  Good    PT Frequency  2x / week    PT Duration  6 weeks    PT Treatment/Interventions  ADLs/Self Care Home Management;Cryotherapy;Electrical Stimulation;Ultrasound;Moist Heat;Gait training;Stair training;Functional mobility training;Therapeutic activities;Therapeutic exercise;Neuromuscular re-education;Manual techniques;Patient/family education;Passive range of motion;Vasopneumatic Device    PT Next Visit Plan  Nustep and progress to stationary bike.  Vaso and e'stim.  PRE's to right knee.    Consulted and Agree with Plan of Care  Patient       Patient will benefit from skilled therapeutic intervention in order to improve the following deficits and impairments:  Abnormal gait, Difficulty walking, Decreased activity tolerance, Decreased strength, Increased edema, Decreased range of motion, Pain  Visit Diagnosis: Chronic pain of right knee  Stiffness of right knee, not elsewhere classified     Problem List There are no problems to display for this patient.   Standley Brooking, PTA 05/13/2019, 10:08 AM  [facility] [address] [place], [place], [address] Phone: [phone]   Fax:  [phone]  Name: Jamie Doyle MRN: [id number] Date of Birth: 01-26-1975

## 2019-05-14 DIAGNOSIS — S83411A Sprain of medial collateral ligament of right knee, initial encounter: Secondary | ICD-10-CM | POA: Diagnosis not present

## 2019-05-15 ENCOUNTER — Encounter: Payer: BC Managed Care – PPO | Admitting: Physical Therapy

## 2019-05-16 ENCOUNTER — Ambulatory Visit: Payer: BC Managed Care – PPO | Admitting: Physical Therapy

## 2019-05-16 ENCOUNTER — Other Ambulatory Visit: Payer: Self-pay

## 2019-05-16 DIAGNOSIS — M25561 Pain in right knee: Secondary | ICD-10-CM | POA: Diagnosis not present

## 2019-05-16 DIAGNOSIS — M25661 Stiffness of right knee, not elsewhere classified: Secondary | ICD-10-CM | POA: Diagnosis not present

## 2019-05-16 DIAGNOSIS — G8929 Other chronic pain: Secondary | ICD-10-CM | POA: Diagnosis not present

## 2019-05-16 DIAGNOSIS — M6281 Muscle weakness (generalized): Secondary | ICD-10-CM | POA: Diagnosis not present

## 2019-05-16 NOTE — Therapy (Signed)
[facility] [place], [place], [address] Phone: [phone]   Fax:  [phone]  Physical Therapy Treatment  Patient Details  Name: Jamie Doyle MRN: [id number] Date of Birth: 1975-11-03 Referring Provider (PT): Jaynee Eagles MD   Encounter Date: 05/16/2019  PT End of Session - 05/16/19 1040    Visit Number  8    Number of Visits  12    Date for PT Re-Evaluation  06/11/19    PT Start Time  0900    PT Stop Time  0951    PT Time Calculation (min)  51 min    Activity Tolerance  Patient tolerated treatment well    Behavior During Therapy  [facility] for tasks assessed/performed       No past medical history on file.  No past surgical history on file.  There were no vitals filed for this visit.  Subjective Assessment - 05/16/19 1003    Subjective  COVID-19 screen performed prior to patient entering clinic.  Got my DonJoy brace.    Pertinent History  Asthma, HTN, Cholecystectomy, tubal ligation.    How long can you sit comfortably?  30 minutes.    How long can you stand comfortably?  <15 minutes.    How long can you walk comfortably?  Short community distances.    Diagnostic tests  MRI.    Patient Stated Goals  Get back to normal.    Currently in Pain?  Yes    Pain Score  5     Pain Location  Knee    Pain Descriptors / Indicators  Restless;Discomfort    Pain Onset  More than a month ago                       [facility] Adult PT Treatment/Exercise - 05/16/19 0001      Exercises   Exercises  Knee/Hip      Knee/Hip Exercises: Aerobic   Nustep  Level x 16 minutes.      Acupuncturist Location  Rigth medial knee.    Electrical Stimulation Action  Pre-mod.    Electrical Stimulation Parameters  80-150 Hz x 20 minutes.    Electrical Stimulation Goals  Pain      Vasopneumatic   Number Minutes Vasopneumatic   20 minutes    Vasopnuematic Location   --   Right knee.    Vasopneumatic Pressure  Low                  PT Long Term Goals - 04/08/19 1204      PT LONG TERM GOAL #1   Title  Independent with a HEP.    Baseline  No knowledge of appropriate ther ex.    Time  6    Period  Weeks    Status  On-going      PT LONG TERM GOAL #2   Title  Active knee flexion to 120-125 degrees+ so the patient can perform functional tasks and do so with pain not > 2-3/10.    Baseline  90 degrees.    Time  6    Period  Weeks    Status  On-going      PT LONG TERM GOAL #3   Title  Increase right knee strength to a solid 4+/5 to provide good stability for accomplishment of functional activities.    Baseline  4-/5.    Time  6    Period  Weeks    Status  On-going      PT LONG TERM GOAL #5   Title  Walk in clinic without deviation.    Baseline  Antalgic stiff-legged gait pattern.    Time  6    Period  Weeks    Status  On-going            Plan - 05/16/19 1036    Clinical Impression Statement  helped patient with her DonJoy brace.  Strap #5 needs to be altered.  The medial pad is not snug against her left knee.  She will be calling the company.    Personal Factors and Comorbidities  Comorbidity 1    Comorbidities  Asthma, HTN, Cholecystectomy, tubal ligation.    Examination-Activity Limitations  Locomotion Level;Other    Stability/Clinical Decision Making  Evolving/Moderate complexity    Rehab Potential  Good    PT Frequency  2x / week    PT Duration  6 weeks    PT Treatment/Interventions  ADLs/Self Care Home Management;Cryotherapy;Electrical Stimulation;Ultrasound;Moist Heat;Gait training;Stair training;Functional mobility training;Therapeutic activities;Therapeutic exercise;Neuromuscular re-education;Manual techniques;Patient/family education;Passive range of motion;Vasopneumatic Device    PT Next Visit Plan  Nustep and progress to stationary bike.  Vaso and e'stim.  PRE's to right knee.    Consulted and Agree with Plan of Care  Patient        Patient will benefit from skilled therapeutic intervention in order to improve the following deficits and impairments:  Abnormal gait, Difficulty walking, Decreased activity tolerance, Decreased strength, Increased edema, Decreased range of motion, Pain  Visit Diagnosis: Chronic pain of right knee  Stiffness of right knee, not elsewhere classified     Problem List There are no problems to display for this patient.   Jordi Kamm, [place] MPT 05/16/2019, 10:43 AM  [facility] [address] [place], [place], [address] Phone: [phone]   Fax:  [phone]  Name: Jamie Doyle MRN: [id number] Date of Birth: 07-May-1975

## 2019-05-21 ENCOUNTER — Ambulatory Visit: Payer: BC Managed Care – PPO | Admitting: Physical Therapy

## 2019-05-21 ENCOUNTER — Other Ambulatory Visit: Payer: Self-pay

## 2019-05-21 DIAGNOSIS — M6281 Muscle weakness (generalized): Secondary | ICD-10-CM | POA: Diagnosis not present

## 2019-05-21 DIAGNOSIS — G8929 Other chronic pain: Secondary | ICD-10-CM | POA: Diagnosis not present

## 2019-05-21 DIAGNOSIS — M25561 Pain in right knee: Secondary | ICD-10-CM

## 2019-05-21 DIAGNOSIS — M25661 Stiffness of right knee, not elsewhere classified: Secondary | ICD-10-CM

## 2019-05-21 NOTE — Therapy (Signed)
[facility] [place], [place], [address] Phone: [phone]   Fax:  [phone]  Physical Therapy Treatment  Patient Details  Name: Jamie Doyle MRN: [id number] Date of Birth: January 04, 1976 Referring Provider (PT): Jaynee Eagles MD   Encounter Date: 05/21/2019  PT End of Session - 05/21/19 1042    Visit Number  9    Number of Visits  12    Date for PT Re-Evaluation  06/11/19    PT Start Time  0956   Late for treatment.   PT Stop Time  1036    PT Time Calculation (min)  40 min    Activity Tolerance  Patient tolerated treatment well    Behavior During Therapy  [facility] for tasks assessed/performed       No past medical history on file.  No past surgical history on file.  There were no vitals filed for this visit.  Subjective Assessment - 05/21/19 1039    Subjective  COVID-19 screen performed prior to patient entering clinic.  Pain about a 3 today.    Pertinent History  Asthma, HTN, Cholecystectomy, tubal ligation.    How long can you sit comfortably?  30 minutes.    Currently in Pain?  Yes    Pain Score  3     Pain Location  Knee    Pain Orientation  Right    Pain Descriptors / Indicators  Aching;Discomfort    Pain Type  Chronic pain                       [facility] Adult PT Treatment/Exercise - 05/21/19 0001      Exercises   Exercises  Knee/Hip      Knee/Hip Exercises: Aerobic   Nustep  Level 4 x 15 minutes.      Knee/Hip Exercises: Seated   Long Arc Quad Limitations  5# x 3 minutes.      Modalities   Modalities  Electrical Stimulation;Vasopneumatic      Electrical Stimulation   Electrical Stimulation Location  Right medial knee.    Electrical Stimulation Action  Pre-mod.    Electrical Stimulation Parameters  80-150 Hz x 15 minutes.    Electrical Stimulation Goals  Pain      Vasopneumatic   Number Minutes Vasopneumatic   15 minutes    Vasopnuematic Location   --   Right knee.   Vasopneumatic  Pressure  Low                  PT Long Term Goals - 04/08/19 1204      PT LONG TERM GOAL #1   Title  Independent with a HEP.    Baseline  No knowledge of appropriate ther ex.    Time  6    Period  Weeks    Status  On-going      PT LONG TERM GOAL #2   Title  Active knee flexion to 120-125 degrees+ so the patient can perform functional tasks and do so with pain not > 2-3/10.    Baseline  90 degrees.    Time  6    Period  Weeks    Status  On-going      PT LONG TERM GOAL #3   Title  Increase right knee strength to a solid 4+/5 to provide good stability for accomplishment of functional activities.    Baseline  4-/5.    Time  6    Period  Weeks  Status  On-going      PT LONG TERM GOAL #5   Title  Walk in clinic without deviation.    Baseline  Antalgic stiff-legged gait pattern.    Time  6    Period  Weeks    Status  On-going            Plan - 05/21/19 1113    Clinical Impression Statement  Patient wiht lower pain today.  She did well with 5# long arc quads.       Patient will benefit from skilled therapeutic intervention in order to improve the following deficits and impairments:     Visit Diagnosis: Chronic pain of right knee  Stiffness of right knee, not elsewhere classified  Acute pain of right knee     Problem List There are no problems to display for this patient.   Lanny Donoso, [place] MPT 05/21/2019, 11:15 AM  [facility] [address] [place], [place], [address] Phone: [phone]   Fax:  [phone]  Name: Avonne Berkery MRN: [id number] Date of Birth: 1975/08/08

## 2019-05-23 ENCOUNTER — Other Ambulatory Visit: Payer: Self-pay

## 2019-05-23 ENCOUNTER — Encounter: Payer: Self-pay | Admitting: Physical Therapy

## 2019-05-23 ENCOUNTER — Ambulatory Visit: Payer: BC Managed Care – PPO | Admitting: Physical Therapy

## 2019-05-23 DIAGNOSIS — G8929 Other chronic pain: Secondary | ICD-10-CM | POA: Diagnosis not present

## 2019-05-23 DIAGNOSIS — M25661 Stiffness of right knee, not elsewhere classified: Secondary | ICD-10-CM

## 2019-05-23 DIAGNOSIS — M6281 Muscle weakness (generalized): Secondary | ICD-10-CM | POA: Diagnosis not present

## 2019-05-23 DIAGNOSIS — M25561 Pain in right knee: Secondary | ICD-10-CM | POA: Diagnosis not present

## 2019-05-23 NOTE — Therapy (Addendum)
[facility] [place], [place], [address] Phone: [phone]   Fax:  [phone]  Physical Therapy Treatment   Progress Note Reporting Period 03/19/2019 to 05/23/2019  See note below for Objective Data and Assessment of Progress/Goals. Patient still with instability in the right knee but still does not have her brace properly aligned. Gabriela Eves, PT, DPT  PHYSICAL THERAPY DISCHARGE SUMMARY  Visits from Start of Care: 10  Current functional level related to goals / functional outcomes: See below   Remaining deficits: See goals   Education / Equipment: HEP Plan: Patient agrees to discharge.  Patient goals were partially met. Patient is being discharged due to not returning since the last visit.  ?????   Gabriela Eves, PT, DPT 10/17/19  Patient Details  Name: Jamie Doyle MRN: [id number] Date of Birth: [birth year] Referring Provider (PT): Jaynee Eagles MD   Encounter Date: 05/23/2019  PT End of Session - 05/23/19 0922    Visit Number  10    Number of Visits  12    Date for PT Re-Evaluation  06/11/19    Authorization Type  Medicaid, 05/16/19 - 05/29/2019    Authorization - Visit Number  3    Authorization - Number of Visits  4    PT Start Time  0900    PT Stop Time  0952    PT Time Calculation (min)  52 min    Activity Tolerance  Patient tolerated treatment well    Behavior During Therapy  [facility] for tasks assessed/performed       History reviewed. No pertinent past medical history.  History reviewed. No pertinent surgical history.  There were no vitals filed for this visit.  Subjective Assessment - 05/23/19 0903    Subjective  COVID-19 screen performed prior to patient entering clinic.  "Feeling better today. I'm about a 3 or 4/10." Reports she still hasn't gotten a response from the [employer] representative to address her brace concerns. She almost fell yesterday.    Pertinent History  Asthma, HTN,  Cholecystectomy, tubal ligation.    How long can you sit comfortably?  30 minutes.    How long can you stand comfortably?  <15 minutes.    How long can you walk comfortably?  Short community distances.    Diagnostic tests  MRI.    Patient Stated Goals  Get back to normal.    Currently in Pain?  Yes    Pain Score  3     Pain Location  Knee    Pain Orientation  Right    Pain Descriptors / Indicators  Aching;Discomfort    Pain Type  Chronic pain    Pain Onset  More than a month ago    Pain Frequency  Constant                                    PT Long Term Goals - 04/08/19 1204      PT LONG TERM GOAL #1   Title  Independent with a HEP.    Baseline  No knowledge of appropriate ther ex.    Time  6    Period  Weeks    Status  On-going      PT LONG TERM GOAL #2   Title  Active knee flexion to 120-125 degrees+ so the patient can perform functional tasks and do so with pain not > 2-3/10.  Baseline  90 degrees.    Time  6    Period  Weeks    Status  On-going      PT LONG TERM GOAL #3   Title  Increase right knee strength to a solid 4+/5 to provide good stability for accomplishment of functional activities.    Baseline  4-/5.    Time  6    Period  Weeks    Status  On-going      PT LONG TERM GOAL #5   Title  Walk in clinic without deviation.    Baseline  Antalgic stiff-legged gait pattern.    Time  6    Period  Weeks    Status  On-going            Plan - 05/23/19 1032    Clinical Impression Statement  Patient responded well to therapy session with minimal pain, just fatigue and muscle soreness due to weakness. Patient demonstrated good form with all TEs after tactile cuing to prevent trunk rotation during sidelying clam shells. No adverse affects upon removal of modalities.    Personal Factors and Comorbidities  Comorbidity 1    Comorbidities  Asthma, HTN, Cholecystectomy, tubal ligation.    Examination-Activity Limitations  Locomotion  Level;Other    Examination-Participation Restrictions  Community Activity;Other    Stability/Clinical Decision Making  Evolving/Moderate complexity    Clinical Decision Making  Low    Rehab Potential  Good    PT Frequency  2x / week    PT Duration  6 weeks    PT Treatment/Interventions  ADLs/Self Care Home Management;Cryotherapy;Electrical Stimulation;Ultrasound;Moist Heat;Gait training;Stair training;Functional mobility training;Therapeutic activities;Therapeutic exercise;Neuromuscular re-education;Manual techniques;Patient/family education;Passive range of motion;Vasopneumatic Device    PT Next Visit Plan  Nustep and progress to stationary bike.  Vaso and e'stim.  PRE's to right knee.    Consulted and Agree with Plan of Care  Patient       Patient will benefit from skilled therapeutic intervention in order to improve the following deficits and impairments:  Abnormal gait, Difficulty walking, Decreased activity tolerance, Decreased strength, Increased edema, Decreased range of motion, Pain  Visit Diagnosis: Chronic pain of right knee  Stiffness of right knee, not elsewhere classified  Muscle weakness (generalized)     Problem List There are no problems to display for this patient.   Gabriela Eves, PT, DPT 05/23/2019, 12:38 PM  [facility] [place], [place], [address] Phone: [phone]   Fax:  [phone]  Name: Jamie Doyle MRN: [id number] Date of Birth: Mar 09, 1975

## 2019-05-24 DIAGNOSIS — J4540 Moderate persistent asthma, uncomplicated: Secondary | ICD-10-CM | POA: Diagnosis not present

## 2019-05-24 DIAGNOSIS — U071 COVID-19: Secondary | ICD-10-CM | POA: Diagnosis not present

## 2019-05-30 DIAGNOSIS — U071 COVID-19: Secondary | ICD-10-CM | POA: Diagnosis not present

## 2019-05-30 DIAGNOSIS — J4540 Moderate persistent asthma, uncomplicated: Secondary | ICD-10-CM | POA: Diagnosis not present

## 2019-06-23 DIAGNOSIS — J4540 Moderate persistent asthma, uncomplicated: Secondary | ICD-10-CM | POA: Diagnosis not present

## 2019-06-23 DIAGNOSIS — U071 COVID-19: Secondary | ICD-10-CM | POA: Diagnosis not present

## 2019-06-23 DIAGNOSIS — R062 Wheezing: Secondary | ICD-10-CM | POA: Diagnosis not present

## 2019-06-23 DIAGNOSIS — R0602 Shortness of breath: Secondary | ICD-10-CM | POA: Diagnosis not present

## 2019-06-23 DIAGNOSIS — Z975 Presence of (intrauterine) contraceptive device: Secondary | ICD-10-CM | POA: Diagnosis not present

## 2019-06-23 DIAGNOSIS — Z9049 Acquired absence of other specified parts of digestive tract: Secondary | ICD-10-CM | POA: Diagnosis not present

## 2019-06-23 DIAGNOSIS — R05 Cough: Secondary | ICD-10-CM | POA: Diagnosis not present

## 2019-06-23 DIAGNOSIS — J4541 Moderate persistent asthma with (acute) exacerbation: Secondary | ICD-10-CM | POA: Diagnosis not present

## 2019-06-23 DIAGNOSIS — M1990 Unspecified osteoarthritis, unspecified site: Secondary | ICD-10-CM | POA: Diagnosis not present

## 2019-06-23 DIAGNOSIS — Z9851 Tubal ligation status: Secondary | ICD-10-CM | POA: Diagnosis not present

## 2019-06-30 DIAGNOSIS — J4540 Moderate persistent asthma, uncomplicated: Secondary | ICD-10-CM | POA: Diagnosis not present

## 2019-06-30 DIAGNOSIS — U071 COVID-19: Secondary | ICD-10-CM | POA: Diagnosis not present

## 2019-07-11 DIAGNOSIS — S83411A Sprain of medial collateral ligament of right knee, initial encounter: Secondary | ICD-10-CM | POA: Diagnosis not present

## 2019-07-11 DIAGNOSIS — G894 Chronic pain syndrome: Secondary | ICD-10-CM | POA: Diagnosis not present

## 2019-07-11 DIAGNOSIS — M8788 Other osteonecrosis, other site: Secondary | ICD-10-CM | POA: Diagnosis not present

## 2019-07-24 DIAGNOSIS — J4540 Moderate persistent asthma, uncomplicated: Secondary | ICD-10-CM | POA: Diagnosis not present

## 2019-07-24 DIAGNOSIS — U071 COVID-19: Secondary | ICD-10-CM | POA: Diagnosis not present

## 2019-07-30 DIAGNOSIS — U071 COVID-19: Secondary | ICD-10-CM | POA: Diagnosis not present

## 2019-07-30 DIAGNOSIS — J4540 Moderate persistent asthma, uncomplicated: Secondary | ICD-10-CM | POA: Diagnosis not present

## 2019-08-13 DIAGNOSIS — S83411A Sprain of medial collateral ligament of right knee, initial encounter: Secondary | ICD-10-CM | POA: Diagnosis not present

## 2019-08-21 DIAGNOSIS — S83411A Sprain of medial collateral ligament of right knee, initial encounter: Secondary | ICD-10-CM | POA: Insufficient documentation

## 2019-08-23 DIAGNOSIS — U071 COVID-19: Secondary | ICD-10-CM | POA: Diagnosis not present

## 2019-08-23 DIAGNOSIS — S83411S Sprain of medial collateral ligament of right knee, sequela: Secondary | ICD-10-CM | POA: Diagnosis not present

## 2019-08-23 DIAGNOSIS — Z5181 Encounter for therapeutic drug level monitoring: Secondary | ICD-10-CM | POA: Diagnosis not present

## 2019-08-23 DIAGNOSIS — Z79891 Long term (current) use of opiate analgesic: Secondary | ICD-10-CM | POA: Diagnosis not present

## 2019-08-23 DIAGNOSIS — M8788 Other osteonecrosis, other site: Secondary | ICD-10-CM | POA: Diagnosis not present

## 2019-08-23 DIAGNOSIS — J4540 Moderate persistent asthma, uncomplicated: Secondary | ICD-10-CM | POA: Diagnosis not present

## 2019-08-23 DIAGNOSIS — M1712 Unilateral primary osteoarthritis, left knee: Secondary | ICD-10-CM | POA: Diagnosis not present

## 2019-08-30 DIAGNOSIS — J4540 Moderate persistent asthma, uncomplicated: Secondary | ICD-10-CM | POA: Diagnosis not present

## 2019-08-30 DIAGNOSIS — U071 COVID-19: Secondary | ICD-10-CM | POA: Diagnosis not present

## 2019-09-02 DIAGNOSIS — I10 Essential (primary) hypertension: Secondary | ICD-10-CM | POA: Diagnosis not present

## 2019-09-02 DIAGNOSIS — J4520 Mild intermittent asthma, uncomplicated: Secondary | ICD-10-CM | POA: Diagnosis not present

## 2019-09-02 DIAGNOSIS — E119 Type 2 diabetes mellitus without complications: Secondary | ICD-10-CM | POA: Diagnosis not present

## 2019-09-02 DIAGNOSIS — Z6836 Body mass index (BMI) 36.0-36.9, adult: Secondary | ICD-10-CM | POA: Diagnosis not present

## 2019-09-23 DIAGNOSIS — J4540 Moderate persistent asthma, uncomplicated: Secondary | ICD-10-CM | POA: Diagnosis not present

## 2019-09-23 DIAGNOSIS — U071 COVID-19: Secondary | ICD-10-CM | POA: Diagnosis not present

## 2019-09-30 DIAGNOSIS — U071 COVID-19: Secondary | ICD-10-CM | POA: Diagnosis not present

## 2019-09-30 DIAGNOSIS — J4540 Moderate persistent asthma, uncomplicated: Secondary | ICD-10-CM | POA: Diagnosis not present

## 2019-10-10 DIAGNOSIS — Z96651 Presence of right artificial knee joint: Secondary | ICD-10-CM | POA: Diagnosis not present

## 2019-10-10 DIAGNOSIS — J45909 Unspecified asthma, uncomplicated: Secondary | ICD-10-CM | POA: Diagnosis not present

## 2019-10-10 DIAGNOSIS — M170 Bilateral primary osteoarthritis of knee: Secondary | ICD-10-CM | POA: Diagnosis not present

## 2019-10-10 DIAGNOSIS — M25361 Other instability, right knee: Secondary | ICD-10-CM | POA: Diagnosis not present

## 2019-10-10 DIAGNOSIS — Z471 Aftercare following joint replacement surgery: Secondary | ICD-10-CM | POA: Diagnosis not present

## 2019-10-10 DIAGNOSIS — R011 Cardiac murmur, unspecified: Secondary | ICD-10-CM | POA: Diagnosis not present

## 2019-10-10 DIAGNOSIS — S83511A Sprain of anterior cruciate ligament of right knee, initial encounter: Secondary | ICD-10-CM | POA: Diagnosis not present

## 2019-10-10 DIAGNOSIS — K219 Gastro-esophageal reflux disease without esophagitis: Secondary | ICD-10-CM | POA: Diagnosis not present

## 2019-10-10 DIAGNOSIS — I10 Essential (primary) hypertension: Secondary | ICD-10-CM | POA: Diagnosis not present

## 2019-10-10 DIAGNOSIS — G8918 Other acute postprocedural pain: Secondary | ICD-10-CM | POA: Diagnosis not present

## 2019-10-10 DIAGNOSIS — Z9049 Acquired absence of other specified parts of digestive tract: Secondary | ICD-10-CM | POA: Diagnosis not present

## 2019-10-10 DIAGNOSIS — E669 Obesity, unspecified: Secondary | ICD-10-CM | POA: Diagnosis not present

## 2019-10-10 DIAGNOSIS — Z8669 Personal history of other diseases of the nervous system and sense organs: Secondary | ICD-10-CM | POA: Diagnosis not present

## 2019-10-10 DIAGNOSIS — M2241 Chondromalacia patellae, right knee: Secondary | ICD-10-CM | POA: Diagnosis not present

## 2019-10-10 DIAGNOSIS — Z9851 Tubal ligation status: Secondary | ICD-10-CM | POA: Diagnosis not present

## 2019-10-10 DIAGNOSIS — Z6836 Body mass index (BMI) 36.0-36.9, adult: Secondary | ICD-10-CM | POA: Diagnosis not present

## 2019-10-10 DIAGNOSIS — S83411A Sprain of medial collateral ligament of right knee, initial encounter: Secondary | ICD-10-CM | POA: Diagnosis not present

## 2019-10-10 DIAGNOSIS — S83281A Other tear of lateral meniscus, current injury, right knee, initial encounter: Secondary | ICD-10-CM | POA: Diagnosis not present

## 2019-10-10 DIAGNOSIS — Z79899 Other long term (current) drug therapy: Secondary | ICD-10-CM | POA: Diagnosis not present

## 2019-10-10 DIAGNOSIS — Z9889 Other specified postprocedural states: Secondary | ICD-10-CM | POA: Insufficient documentation

## 2019-10-17 ENCOUNTER — Ambulatory Visit: Payer: BC Managed Care – PPO | Attending: Sports Medicine | Admitting: Physical Therapy

## 2019-10-17 ENCOUNTER — Other Ambulatory Visit: Payer: Self-pay

## 2019-10-17 ENCOUNTER — Encounter: Payer: Self-pay | Admitting: Physical Therapy

## 2019-10-17 DIAGNOSIS — M25561 Pain in right knee: Secondary | ICD-10-CM | POA: Diagnosis not present

## 2019-10-17 DIAGNOSIS — G8929 Other chronic pain: Secondary | ICD-10-CM | POA: Diagnosis not present

## 2019-10-17 DIAGNOSIS — R600 Localized edema: Secondary | ICD-10-CM | POA: Diagnosis not present

## 2019-10-17 DIAGNOSIS — M25661 Stiffness of right knee, not elsewhere classified: Secondary | ICD-10-CM | POA: Diagnosis not present

## 2019-10-17 DIAGNOSIS — M6281 Muscle weakness (generalized): Secondary | ICD-10-CM

## 2019-10-17 NOTE — Therapy (Signed)
[facility] [address] [place], [place], [address] Phone: [phone]   Fax:  [phone]  Physical Therapy Evaluation  Patient Details  Name: Jamie Doyle MRN: [id number] Date of Birth: 07/12/1975 Referring Provider (PT): Elinor Dodgehristopher Brumfield, MD   Encounter Date: 10/17/2019   PT End of Session - 10/17/19 1447    Visit Number 1    Number of Visits 18    Date for PT Re-Evaluation 12/05/19    Authorization Type BCBS; Medicaid; FOTO (please complete on visit 2), Progress note every 10th visit    PT Start Time 1300    PT Stop Time 1348    PT Time Calculation (min) 48 min    Equipment Utilized During Treatment Other (comment);Right knee immobilizer   bilateral axillary crutches   Activity Tolerance Patient tolerated treatment well;Patient limited by pain    Behavior During Therapy [facility] for tasks assessed/performed           History reviewed. No pertinent past medical history.  History reviewed. No pertinent surgical history.  There were no vitals filed for this visit.    Subjective Assessment - 10/17/19 1451    Subjective COVID-19 screening performed upon arrival. Patient arrives to physical therapy with right knee pain, difficulty walking, and difficulty performing ADLs due to a right knee arthroscopy with allograft ACL reconstruction, allograft MCL reconstruction, partial lateral meniscectomy, and debridement/chondroplasty on 10/10/2019. Patient reports gaining assistance for home activities and ADLs from husband, children, and friends from church. Patient is modified independent with ambulation with crutches, walker, and WC. Patient reports pain at worst as 10/10 in right knee and pain at best as 5/10 with pain medication, muscle relaxer, and ice machine. Patient's goals are to decrease pain, improve movement, improve strength, sleep longer than 3 hrs, stand longer than 10 mins, and improve ability to perform ADLs and home activities.     Pertinent History RIGHT knee arthroscopy with allograft ACL reconstruction, allograft MCL reconstruction, partial lateral meniscectomy, and debridement/chondroplasty 10/10/2019, Asthma, HTN, Cholecystectomy, tubal ligation.    Limitations Sitting;Standing;Walking;House hold activities    How long can you sit comfortably? short periods    How long can you stand comfortably? short periods    How long can you walk comfortably? short periods    Diagnostic tests MRI confirmed MCL, ACL and meniscus tear    Patient Stated Goals Decrease pain, Get back to usual routine    Currently in Pain? Yes    Pain Score 8     Pain Location Knee    Pain Orientation Right    Pain Descriptors / Indicators Aching;Throbbing    Pain Type Surgical pain    Pain Onset 1 to 4 weeks ago    Pain Frequency Constant    Aggravating Factors  "movement"    Pain Relieving Factors "rest, meds, ice"    Effect of Pain on Daily Activities "unable to do ADLs"              [facility] - 10/17/19 0001      Assessment   Medical Diagnosis Complete tear of MCL of knee, right, initial encounter    Referring Provider (PT) Elinor Dodgehristopher Brumfield, MD    Onset Date/Surgical Date 10/10/19    Next MD Visit 10/22/2019    Prior Therapy yes      Precautions   Precautions Knee    Precaution Comments per protocol MCL, ACL, and meniscus repair      Restrictions   Weight Bearing Restrictions Yes  RLE Weight Bearing Non weight bearing      Balance Screen   Has the patient fallen in the past 6 months No    Has the patient had a decrease in activity level because of a fear of falling?  No    Is the patient reluctant to leave their home because of a fear of falling?  No      Home Environment   Living Environment Private residence    Living Arrangements Spouse/significant other;Children    Home Access Level entry    Home Layout Able to live on main level with bedroom/bathroom    Home Equipment Shower seat;Toilet  riser;Walker - 2 wheels;Crutches      Prior Function   Level of Independence Needs assistance with homemaking;Needs assistance with ADLs      Observation/Other Assessments   Skin Integrity Port incisions donned with band aids, slight brusing to medial left thigh'    Focus on Therapeutic Outcomes (FOTO)  Please do FOTO 2nd visit      Observation/Other Assessments-Edema    Edema Circumferential      Circumferential Edema   Circumferential - Right 51.8 cm mid patella    Circumferential - Left  48 cm mid patella      ROM / Strength   AROM / PROM / Strength PROM      PROM   Overall PROM  Deficits;Due to pain    PROM Assessment Site Knee    Right/Left Knee Right    Right Knee Extension 8    Right Knee Flexion 41      Palpation   Patella mobility decreased patella mobilization    Palpation comment minimal tenderness to palpation R quads, and R calf      Transfers   Transfers Sit to Stand;Stand to Sit;Sit to Supine    Sit to Stand 5: Supervision    Stand to Sit 5: Supervision    Sit to Supine 4: Min assist;With upper extremity assist    Sit to Supine Details  PT assist and right LE       Ambulation/Gait   Assistive device Crutches    Gait Pattern Step-to pattern;Decreased stride length    Gait Comments NWB gait pattern with right knee in immobilizer                      Objective measurements completed on examination: See above findings.               PT Education - 10/17/19 1456    Education Details ice, elevation, and ankle pumps for edema control and management, NWB status    Person(s) Educated Patient    Methods Explanation    Comprehension Verbalized understanding            PT Short Term Goals - 10/17/19 1438      PT SHORT TERM GOAL #1   Title Patient will be independent with HEP    Baseline no knowledge of HEP    Time 4    Period Weeks    Status New      PT SHORT TERM GOAL #2   Title Patient will demonstrate 90+ degrees of right  knee flexion PROM to improve ROM    Baseline 41 degrees right knee PROM    Time 4    Period Weeks    Status New      PT SHORT TERM GOAL #3   Title Patient will demonstrate 0 degrees of right knee  extension PROM to improve ROM    Baseline 8 degrees from neutral right knee extension PROM    Time 4    Period Weeks    Status New             PT Long Term Goals - 10/17/19 1440      PT LONG TERM GOAL #1   Title Patient will be independent with advanced HEP    Baseline No knowledge of appropriate advanced HEP    Time 6    Period Weeks    Status New      PT LONG TERM GOAL #2   Title Patient will demonstrate 0-120+ degrees of right knee extension/flexion AROM to improve ability to perform functional tasks.    Baseline 41 degrees right knee flexion PROM, 8 degrees from neutral right knee extension PROM; no active ROM per protocol    Time 6    Period Weeks    Status New      PT LONG TERM GOAL #3   Title Patient will ambulate with a SPC or no AD with minimal gait deviations and right knee pain less than or equal to 3/10.    Baseline ambulating with bilateral axillary crutches and NWB right knee pain average 8/10    Time 6    Period Weeks    Status New      PT LONG TERM GOAL #4   Title Patient will report ability to perform ADLs and home activities with right knee pain less than or equal to 4/10.    Baseline unable to perform ADLs and home activities without assistance.    Time 6    Period Weeks    Status New      PT LONG TERM GOAL #5   Title Patient will demonstrate reciprocating stair ambulations with one rail to safely access 2nd floor of home.    Baseline unable at this time    Time 6    Period Weeks    Status New                  Plan - 10/17/19 1456    Clinical Impression Statement Patient is a 44 year old female who presents to physical therapy with right knee pain, decreased right knee PROM, difficulty walking, and increased edema secondary to a right knee  arthroscopy with allograft ACL reconstruction, allograft MCL reconstruction, partial lateral meniscectomy, and debridement/chondroplasty 10/10/2019. Patient with minimal tenderness to right quad and calf upon palpation. Patient with decreased right patella mobilization. Patient is able to doff immobilizer independently however requires min assistance for donning. Patient requires min A at right LE for sit to supine transfer. Patient ambulates with bilateral axillary crutches with proper NWB gait mechanics. Patient and PT discussed plan of care and discussed HEP for edema management to which patient reported understanding. Patient would  benefit from skilled physical therapy to address deficits and address patient's goals.    Personal Factors and Comorbidities Comorbidity 3+;Time since onset of injury/illness/exacerbation    Comorbidities RIGHT knee arthroscopy with allograft ACL reconstruction, allograft MCL reconstruction, partial lateral meniscectomy, and debridement/chondroplasty 10/10/2019,Asthma, HTN, Cholecystectomy, tubal ligation.    Examination-Activity Limitations Locomotion Level;Other;Hygiene/Grooming;Dressing;Stand;Stairs;Squat;Sleep;Bend;Transfers;Bed Mobility    Examination-Participation Restrictions Community Activity;Other    Stability/Clinical Decision Making Stable/Uncomplicated    Clinical Decision Making Low    Rehab Potential Good    PT Frequency 3x / week    PT Duration 6 weeks    PT Treatment/Interventions ADLs/Self Care Home Management;Cryotherapy;Electrical Stimulation;Ultrasound;Moist Heat;Gait  training;Stair training;Functional mobility training;Therapeutic activities;Therapeutic exercise;Neuromuscular re-education;Manual techniques;Patient/family education;Passive range of motion;Vasopneumatic Device;Balance training;Scar mobilization;Taping    PT Next Visit Plan FOTO please, PROM to right knee, modalities PRN for pain relief.    Consulted and Agree with Plan of Care Patient            Patient will benefit from skilled therapeutic intervention in order to improve the following deficits and impairments:  Abnormal gait, Difficulty walking, Decreased activity tolerance, Decreased strength, Increased edema, Decreased range of motion, Pain, Decreased balance  Visit Diagnosis: Stiffness of right knee, not elsewhere classified - Plan: PT plan of care cert/re-cert  Muscle weakness (generalized) - Plan: PT plan of care cert/re-cert  Acute pain of right knee - Plan: PT plan of care cert/re-cert  Localized edema - Plan: PT plan of care cert/re-cert     Problem List There are no problems to display for this patient.   Guss Bunde, PT, DPT 10/17/2019, 3:09 PM  [facility] [address] [place], [place], [address] Phone: [phone]   Fax:  [phone]  Name: Lucila Klecka MRN: [id number] Date of Birth: 04-24-1975

## 2019-10-21 ENCOUNTER — Encounter: Payer: Self-pay | Admitting: Physical Therapy

## 2019-10-22 DIAGNOSIS — S83411A Sprain of medial collateral ligament of right knee, initial encounter: Secondary | ICD-10-CM | POA: Diagnosis not present

## 2019-10-23 ENCOUNTER — Encounter: Payer: Self-pay | Admitting: Physical Therapy

## 2019-10-23 ENCOUNTER — Ambulatory Visit: Payer: BC Managed Care – PPO | Admitting: Physical Therapy

## 2019-10-23 ENCOUNTER — Other Ambulatory Visit: Payer: Self-pay

## 2019-10-23 DIAGNOSIS — R600 Localized edema: Secondary | ICD-10-CM

## 2019-10-23 DIAGNOSIS — M25561 Pain in right knee: Secondary | ICD-10-CM | POA: Diagnosis not present

## 2019-10-23 DIAGNOSIS — M25661 Stiffness of right knee, not elsewhere classified: Secondary | ICD-10-CM

## 2019-10-23 DIAGNOSIS — M6281 Muscle weakness (generalized): Secondary | ICD-10-CM | POA: Diagnosis not present

## 2019-10-23 DIAGNOSIS — G8929 Other chronic pain: Secondary | ICD-10-CM

## 2019-10-23 NOTE — Therapy (Signed)
[facility] [address] [place], [place], [address] Phone: [phone]   Fax:  [phone]  Physical Therapy Treatment  Patient Details  Name: Jamie Doyle MRN: [id number] Date of Birth: 1975-06-01 Referring Provider (PT): Elinor Dodge, MD   Encounter Date: 10/23/2019   PT End of Session - 10/23/19 1210    Visit Number 2    Number of Visits 18    Date for PT Re-Evaluation 12/05/19    Authorization Type BCBS; Medicaid; FOTO (please complete on visit 2), Progress note every 10th visit    PT Start Time 1115    PT Stop Time 1151    PT Time Calculation (min) 36 min    Equipment Utilized During Treatment Other (comment);Right knee immobilizer    Activity Tolerance Patient tolerated treatment well;Patient limited by pain    Behavior During Therapy [facility] for tasks assessed/performed           History reviewed. No pertinent past medical history.  History reviewed. No pertinent surgical history.  There were no vitals filed for this visit.   Subjective Assessment - 10/23/19 1143    Subjective COVID-19 screen performed prior to patient entering clinic.  Saw doctor yesterday.  Can put as much weight on my right leg as I can tolerated with my knee brace locked in full extension.    Pertinent History RIGHT knee arthroscopy with allograft ACL reconstruction, allograft MCL reconstruction, partial lateral meniscectomy, and debridement/chondroplasty 10/10/2019, Asthma, HTN, Cholecystectomy, tubal ligation.    Limitations Sitting;Standing;Walking;House hold activities    How long can you sit comfortably? short periods    How long can you stand comfortably? short periods    How long can you walk comfortably? short periods    Diagnostic tests MRI confirmed MCL, ACL and meniscus tear    Patient Stated Goals Decrease pain, Get back to usual routine    Currently in Pain? Yes    Pain Score 7     Pain Location Knee                              [facility] Adult PT Treatment/Exercise - 10/23/19 0001      Exercises   Exercises Knee/Hip      Knee/Hip Exercises: Supine   Quad Sets Limitations 20 minutes facilitated with Bi-Phasic electrical stimulation (4 electrodes) with 10 sec extension holds and 10 sec rest).      Modalities   Modalities --      Manual Therapy   Manual Therapy Passive ROM    Passive ROM In supine:  Gentle right knee passive flexion to patient toleramce x 3 minutes.                    PT Short Term Goals - 10/17/19 1438      PT SHORT TERM GOAL #1   Title Patient will be independent with HEP    Baseline no knowledge of HEP    Time 4    Period Weeks    Status New      PT SHORT TERM GOAL #2   Title Patient will demonstrate 90+ degrees of right knee flexion PROM to improve ROM    Baseline 41 degrees right knee PROM    Time 4    Period Weeks    Status New      PT SHORT TERM GOAL #3   Title Patient will demonstrate 0 degrees of right knee extension PROM  to improve ROM    Baseline 8 degrees from neutral right knee extension PROM    Time 4    Period Weeks    Status New             PT Long Term Goals - 10/17/19 1440      PT LONG TERM GOAL #1   Title Patient will be independent with advanced HEP    Baseline No knowledge of appropriate advanced HEP    Time 6    Period Weeks    Status New      PT LONG TERM GOAL #2   Title Patient will demonstrate 0-120+ degrees of right knee extension/flexion AROM to improve ability to perform functional tasks.    Baseline 41 degrees right knee flexion PROM, 8 degrees from neutral right knee extension PROM; no active ROM per protocol    Time 6    Period Weeks    Status New      PT LONG TERM GOAL #3   Title Patient will ambulate with a SPC or no AD with minimal gait deviations and right knee pain less than or equal to 3/10.    Baseline ambulating with bilateral axillary crutches and NWB right knee pain average 8/10     Time 6    Period Weeks    Status New      PT LONG TERM GOAL #4   Title Patient will report ability to perform ADLs and home activities with right knee pain less than or equal to 4/10.    Baseline unable to perform ADLs and home activities without assistance.    Time 6    Period Weeks    Status New      PT LONG TERM GOAL #5   Title Patient will demonstrate reciprocating stair ambulations with one rail to safely access 2nd floor of home.    Baseline unable at this time    Time 6    Period Weeks    Status New                 Plan - 10/23/19 1208    Clinical Impression Statement Patient did well with treatment today.  Instructed her in supine quad sets for quad activation and to achieve full right knee extension.  She was also instructed in supine hel slides to begin as part of her HEP.    Personal Factors and Comorbidities Comorbidity 3+;Time since onset of injury/illness/exacerbation    Comorbidities RIGHT knee arthroscopy with allograft ACL reconstruction, allograft MCL reconstruction, partial lateral meniscectomy, and debridement/chondroplasty 10/10/2019,Asthma, HTN, Cholecystectomy, tubal ligation.    Examination-Activity Limitations Locomotion Level;Other;Hygiene/Grooming;Dressing;Stand;Stairs;Squat;Sleep;Bend;Transfers;Bed Mobility           Patient will benefit from skilled therapeutic intervention in order to improve the following deficits and impairments:     Visit Diagnosis: Stiffness of right knee, not elsewhere classified  Muscle weakness (generalized)  Acute pain of right knee  Localized edema  Chronic pain of right knee     Problem List There are no problems to display for this patient.   Jamie Doyle, [place] MPT 10/23/2019, 12:12 PM  [facility] [address] [place], [place], [address] Phone: [phone]   Fax:  [phone]  Name: Jamie Doyle MRN: [id number] Date of Birth: 07/20/1975

## 2019-10-24 DIAGNOSIS — U071 COVID-19: Secondary | ICD-10-CM | POA: Diagnosis not present

## 2019-10-24 DIAGNOSIS — J4540 Moderate persistent asthma, uncomplicated: Secondary | ICD-10-CM | POA: Diagnosis not present

## 2019-10-25 ENCOUNTER — Encounter: Payer: Self-pay | Admitting: Physical Therapy

## 2019-10-25 ENCOUNTER — Ambulatory Visit: Payer: BC Managed Care – PPO | Attending: Sports Medicine | Admitting: Physical Therapy

## 2019-10-25 DIAGNOSIS — M25561 Pain in right knee: Secondary | ICD-10-CM | POA: Diagnosis not present

## 2019-10-25 DIAGNOSIS — R600 Localized edema: Secondary | ICD-10-CM | POA: Diagnosis not present

## 2019-10-25 DIAGNOSIS — M25661 Stiffness of right knee, not elsewhere classified: Secondary | ICD-10-CM | POA: Insufficient documentation

## 2019-10-25 DIAGNOSIS — M6281 Muscle weakness (generalized): Secondary | ICD-10-CM | POA: Diagnosis not present

## 2019-10-25 DIAGNOSIS — G8929 Other chronic pain: Secondary | ICD-10-CM | POA: Insufficient documentation

## 2019-10-25 NOTE — Therapy (Signed)
[facility] [address] [place], [place], [address] Phone: [phone]   Fax:  [phone]  Physical Therapy Treatment  Patient Details  Name: Jamie Doyle MRN: [id number] Date of Birth: 1975/11/24 Referring Provider (PT): Elinor Dodge, MD   Encounter Date: 10/25/2019   PT End of Session - 10/25/19 1120    Visit Number 3    Number of Visits 18    Date for PT Re-Evaluation 12/05/19    Authorization Type BCBS; Medicaid; FOTO (please complete on visit 2), Progress note every 10th visit    PT Start Time 1120    PT Stop Time 1156    PT Time Calculation (min) 36 min    Equipment Utilized During Treatment Other (comment);Right knee immobilizer   B axillary crutches   Activity Tolerance Patient tolerated treatment well;Patient limited by pain    Behavior During Therapy [facility] for tasks assessed/performed           History reviewed. No pertinent past medical history.  History reviewed. No pertinent surgical history.  There were no vitals filed for this visit.   Subjective Assessment - 10/25/19 1119    Subjective COVID 19 screening performed on patient upon arrival. Patient reports she is limited by 30 deg flexion.    Pertinent History RIGHT knee arthroscopy with allograft ACL reconstruction, allograft MCL reconstruction, partial lateral meniscectomy, and debridement/chondroplasty 10/10/2019, Asthma, HTN, Cholecystectomy, tubal ligation.    Limitations Sitting;Standing;Walking;House hold activities    How long can you sit comfortably? short periods    How long can you stand comfortably? short periods    How long can you walk comfortably? short periods    Diagnostic tests MRI confirmed MCL, ACL and meniscus tear    Patient Stated Goals Decrease pain, Get back to usual routine    Currently in Pain? Yes    Pain Score 6     Pain Location Knee    Pain Orientation Right    Pain Descriptors / Indicators Aching;Sore    Pain Type  Surgical pain    Pain Onset 1 to 4 weeks ago    Pain Frequency Constant              [facility] PT Assessment - 10/25/19 0001      Assessment   Medical Diagnosis Complete tear of MCL of knee, right, initial encounter    Referring Provider (PT) Elinor Dodge, MD    Onset Date/Surgical Date 10/10/19    Prior Therapy yes      Precautions   Precautions Knee    Precaution Comments per protocol MCL, ACL, and meniscus repair      Restrictions   Weight Bearing Restrictions Yes    RLE Weight Bearing Non weight bearing                         [facility] Adult PT Treatment/Exercise - 10/25/19 0001      Knee/Hip Exercises: Supine   Quad Sets Right    Quad Sets Limitations NMR of R quad with QS    Heel Slides AROM;Right;20 reps    Heel Slides Limitations 30 deg flexion max      Modalities   Modalities Geologist, engineering Location R VMO/Quad    Engineer, manufacturing Russian with QS    Electrical Stimulation Parameters 10/10 x15 min    Electrical Stimulation Goals Neuromuscular facilitation      Manual Therapy  Manual Therapy Passive ROM    Passive ROM PROM of R knee into flexion within 30 deg range                    PT Short Term Goals - 10/17/19 1438      PT SHORT TERM GOAL #1   Title Patient will be independent with HEP    Baseline no knowledge of HEP    Time 4    Period Weeks    Status New      PT SHORT TERM GOAL #2   Title Patient will demonstrate 90+ degrees of right knee flexion PROM to improve ROM    Baseline 41 degrees right knee PROM    Time 4    Period Weeks    Status New      PT SHORT TERM GOAL #3   Title Patient will demonstrate 0 degrees of right knee extension PROM to improve ROM    Baseline 8 degrees from neutral right knee extension PROM    Time 4    Period Weeks    Status New             PT Long Term Goals - 10/17/19 1440      PT LONG TERM GOAL #1     Title Patient will be independent with advanced HEP    Baseline No knowledge of appropriate advanced HEP    Time 6    Period Weeks    Status New      PT LONG TERM GOAL #2   Title Patient will demonstrate 0-120+ degrees of right knee extension/flexion AROM to improve ability to perform functional tasks.    Baseline 41 degrees right knee flexion PROM, 8 degrees from neutral right knee extension PROM; no active ROM per protocol    Time 6    Period Weeks    Status New      PT LONG TERM GOAL #3   Title Patient will ambulate with a SPC or no AD with minimal gait deviations and right knee pain less than or equal to 3/10.    Baseline ambulating with bilateral axillary crutches and NWB right knee pain average 8/10    Time 6    Period Weeks    Status New      PT LONG TERM GOAL #4   Title Patient will report ability to perform ADLs and home activities with right knee pain less than or equal to 4/10.    Baseline unable to perform ADLs and home activities without assistance.    Time 6    Period Weeks    Status New      PT LONG TERM GOAL #5   Title Patient will demonstrate reciprocating stair ambulations with one rail to safely access 2nd floor of home.    Baseline unable at this time    Time 6    Period Weeks    Status New                 Plan - 10/25/19 1219    Clinical Impression Statement Patient presented in clinic with reports of compliance of HEP and edema control at home. Patient remains mindful of R knee ROM. NMR of R quad continued today using QS. PROM remained equal to or less than 30 degrees per protocol. Normal stimulation response noted following removal of the modality.    Personal Factors and Comorbidities Comorbidity 3+;Time since onset of injury/illness/exacerbation    Comorbidities RIGHT knee arthroscopy  with allograft ACL reconstruction, allograft MCL reconstruction, partial lateral meniscectomy, and debridement/chondroplasty 10/10/2019,Asthma, HTN,  Cholecystectomy, tubal ligation.    Examination-Activity Limitations Locomotion Level;Other;Hygiene/Grooming;Dressing;Stand;Stairs;Squat;Sleep;Bend;Transfers;Bed Mobility    Examination-Participation Restrictions Community Activity;Other    Stability/Clinical Decision Making Stable/Uncomplicated    Rehab Potential Good    PT Frequency 3x / week    PT Duration 6 weeks    PT Treatment/Interventions ADLs/Self Care Home Management;Cryotherapy;Electrical Stimulation;Ultrasound;Moist Heat;Gait training;Stair training;Functional mobility training;Therapeutic activities;Therapeutic exercise;Neuromuscular re-education;Manual techniques;Patient/family education;Passive range of motion;Vasopneumatic Device;Balance training;Scar mobilization;Taping    PT Next Visit Plan FOTO please, PROM to right knee, modalities PRN for pain relief.    Consulted and Agree with Plan of Care Patient           Patient will benefit from skilled therapeutic intervention in order to improve the following deficits and impairments:  Abnormal gait, Difficulty walking, Decreased activity tolerance, Decreased strength, Increased edema, Decreased range of motion, Pain, Decreased balance  Visit Diagnosis: Stiffness of right knee, not elsewhere classified  Muscle weakness (generalized)  Acute pain of right knee  Localized edema     Problem List There are no problems to display for this patient.   Marvell Fuller, PTA 10/25/2019, 12:25 PM  [facility] [address] [place], [place], [address] Phone: [phone]   Fax:  [phone]  Name: Jamie Doyle MRN: [id number] Date of Birth: [birth year]

## 2019-10-28 ENCOUNTER — Other Ambulatory Visit: Payer: Self-pay

## 2019-10-28 ENCOUNTER — Ambulatory Visit: Payer: BC Managed Care – PPO | Admitting: Physical Therapy

## 2019-10-28 ENCOUNTER — Encounter: Payer: Self-pay | Admitting: Physical Therapy

## 2019-10-28 DIAGNOSIS — M6281 Muscle weakness (generalized): Secondary | ICD-10-CM | POA: Diagnosis not present

## 2019-10-28 DIAGNOSIS — R600 Localized edema: Secondary | ICD-10-CM | POA: Diagnosis not present

## 2019-10-28 DIAGNOSIS — M25661 Stiffness of right knee, not elsewhere classified: Secondary | ICD-10-CM | POA: Diagnosis not present

## 2019-10-28 DIAGNOSIS — M25561 Pain in right knee: Secondary | ICD-10-CM | POA: Diagnosis not present

## 2019-10-28 DIAGNOSIS — G8929 Other chronic pain: Secondary | ICD-10-CM | POA: Diagnosis not present

## 2019-10-28 NOTE — Therapy (Signed)
[facility] [address] [place], [place], [address] Phone: [phone]   Fax:  [phone]  Physical Therapy Treatment  Patient Details  Name: Jamie Doyle MRN: [id number] Date of Birth: 14-Apr-1975 Referring Provider (PT): Elinor Dodge, MD   Encounter Date: 10/28/2019   PT End of Session - 10/28/19 1459    Visit Number 4    Number of Visits 18    Date for PT Re-Evaluation 12/05/19    Authorization Type BCBS; Medicaid; FOTO (please complete on visit 2), Progress note every 10th visit    PT Start Time 1430    PT Stop Time 1518    PT Time Calculation (min) 48 min    Equipment Utilized During Treatment Other (comment);Right knee immobilizer    Activity Tolerance Patient tolerated treatment well;Patient limited by pain    Behavior During Therapy [facility] for tasks assessed/performed           History reviewed. No pertinent past medical history.  History reviewed. No pertinent surgical history.  There were no vitals filed for this visit.   Subjective Assessment - 10/28/19 1456    Subjective COVID 19 screening performed on patient upon arrival. Patient reports doing well with HEP.    Pertinent History RIGHT knee arthroscopy with allograft ACL reconstruction, allograft MCL reconstruction, partial lateral meniscectomy, and debridement/chondroplasty 10/10/2019, Asthma, HTN, Cholecystectomy, tubal ligation.    Limitations Sitting;Standing;Walking;House hold activities    How long can you sit comfortably? short periods    How long can you stand comfortably? short periods    How long can you walk comfortably? short periods    Diagnostic tests MRI confirmed MCL, ACL and meniscus tear    Patient Stated Goals Decrease pain, Get back to usual routine    Currently in Pain? Yes              [facility] PT Assessment - 10/28/19 0001      Assessment   Medical Diagnosis Complete tear of MCL of knee, right, initial encounter    Referring  Provider (PT) Elinor Dodge, MD    Onset Date/Surgical Date 10/10/19    Prior Therapy yes      Precautions   Precautions Knee    Precaution Comments per protocol MCL, ACL, and meniscus repair      Restrictions   Weight Bearing Restrictions Yes    RLE Weight Bearing Weight bearing as tolerated      PROM   Right Knee Flexion 61   on nustep                        [facility] Adult PT Treatment/Exercise - 10/28/19 0001      Exercises   Exercises Knee/Hip      Knee/Hip Exercises: Aerobic   Nustep level 1 x12 mins; seat 10 to 9      Knee/Hip Exercises: Supine   Quad Sets Right;Other (comment)    Quad Sets Limitations Neuro re-ed for R quad       Modalities   Modalities Geologist, engineering Location R VMO/Quad    Electrical Stimulation Action VMS with QS 4 pad    Electrical Stimulation Parameters 10/10, 280 usec, 50pps, 2 sec ramp x10 mins    Electrical Stimulation Goals Neuromuscular facilitation      Manual Therapy   Manual Therapy Passive ROM    Passive ROM PROM of R knee into knee flexion with gentle holds  at end range                    PT Short Term Goals - 10/17/19 1438      PT SHORT TERM GOAL #1   Title Patient will be independent with HEP    Baseline no knowledge of HEP    Time 4    Period Weeks    Status New      PT SHORT TERM GOAL #2   Title Patient will demonstrate 90+ degrees of right knee flexion PROM to improve ROM    Baseline 41 degrees right knee PROM    Time 4    Period Weeks    Status New      PT SHORT TERM GOAL #3   Title Patient will demonstrate 0 degrees of right knee extension PROM to improve ROM    Baseline 8 degrees from neutral right knee extension PROM    Time 4    Period Weeks    Status New             PT Long Term Goals - 10/17/19 1440      PT LONG TERM GOAL #1   Title Patient will be independent with advanced HEP    Baseline No knowledge  of appropriate advanced HEP    Time 6    Period Weeks    Status New      PT LONG TERM GOAL #2   Title Patient will demonstrate 0-120+ degrees of right knee extension/flexion AROM to improve ability to perform functional tasks.    Baseline 41 degrees right knee flexion PROM, 8 degrees from neutral right knee extension PROM; no active ROM per protocol    Time 6    Period Weeks    Status New      PT LONG TERM GOAL #3   Title Patient will ambulate with a SPC or no AD with minimal gait deviations and right knee pain less than or equal to 3/10.    Baseline ambulating with bilateral axillary crutches and NWB right knee pain average 8/10    Time 6    Period Weeks    Status New      PT LONG TERM GOAL #4   Title Patient will report ability to perform ADLs and home activities with right knee pain less than or equal to 4/10.    Baseline unable to perform ADLs and home activities without assistance.    Time 6    Period Weeks    Status New      PT LONG TERM GOAL #5   Title Patient will demonstrate reciprocating stair ambulations with one rail to safely access 2nd floor of home.    Baseline unable at this time    Time 6    Period Weeks    Status New                 Plan - 10/28/19 1533    Clinical Impression Statement Patient responded well to the additon of the nustep with level 1 resistance. Patient was able to achieve 61 degrees of R knee PROM on nustep. Improved contraction noted with NM re-education with quad set. PROM    Personal Factors and Comorbidities Comorbidity 3+;Time since onset of injury/illness/exacerbation    Comorbidities RIGHT knee arthroscopy with allograft ACL reconstruction, allograft MCL reconstruction, partial lateral meniscectomy, and debridement/chondroplasty 10/10/2019,Asthma, HTN, Cholecystectomy, tubal ligation.    Examination-Activity Limitations Locomotion Level;Other;Hygiene/Grooming;Dressing;Stand;Stairs;Squat;Sleep;Bend;Transfers;Bed Mobility     Examination-Participation Restrictions  Community Activity;Other    Stability/Clinical Decision Making Stable/Uncomplicated    Clinical Decision Making Low    Rehab Potential Good    PT Frequency 3x / week    PT Duration 6 weeks    PT Treatment/Interventions ADLs/Self Care Home Management;Cryotherapy;Electrical Stimulation;Ultrasound;Moist Heat;Gait training;Stair training;Functional mobility training;Therapeutic activities;Therapeutic exercise;Neuromuscular re-education;Manual techniques;Patient/family education;Passive range of motion;Vasopneumatic Device;Balance training;Scar mobilization;Taping    PT Next Visit Plan FOTO please, PROM to right knee, modalities PRN for pain relief.    Consulted and Agree with Plan of Care Patient           Patient will benefit from skilled therapeutic intervention in order to improve the following deficits and impairments:  Abnormal gait, Difficulty walking, Decreased activity tolerance, Decreased strength, Increased edema, Decreased range of motion, Pain, Decreased balance  Visit Diagnosis: Stiffness of right knee, not elsewhere classified  Muscle weakness (generalized)     Problem List There are no problems to display for this patient.   Guss Bunde, PT, DPT 10/28/2019, 3:47 PM  [facility] [address] [place], [place], [address] Phone: [phone]   Fax:  [phone]  Name: Jamie Doyle MRN: [id number] Date of Birth: 20-Jan-1976

## 2019-10-30 ENCOUNTER — Ambulatory Visit: Payer: BC Managed Care – PPO | Admitting: Physical Therapy

## 2019-10-30 ENCOUNTER — Other Ambulatory Visit: Payer: Self-pay

## 2019-10-30 DIAGNOSIS — M25561 Pain in right knee: Secondary | ICD-10-CM | POA: Diagnosis not present

## 2019-10-30 DIAGNOSIS — M6281 Muscle weakness (generalized): Secondary | ICD-10-CM

## 2019-10-30 DIAGNOSIS — M25661 Stiffness of right knee, not elsewhere classified: Secondary | ICD-10-CM | POA: Diagnosis not present

## 2019-10-30 DIAGNOSIS — R600 Localized edema: Secondary | ICD-10-CM | POA: Diagnosis not present

## 2019-10-30 DIAGNOSIS — G8929 Other chronic pain: Secondary | ICD-10-CM | POA: Diagnosis not present

## 2019-10-30 DIAGNOSIS — U071 COVID-19: Secondary | ICD-10-CM | POA: Diagnosis not present

## 2019-10-30 NOTE — Therapy (Signed)
[facility] [address] [place], [place], [address] Phone: [phone]   Fax:  [phone]  Physical Therapy Treatment  Patient Details  Name: Jamie Doyle MRN: [id number] Date of Birth: 17-Sep-1975 Referring Provider (PT): Elinor Dodge, MD   Encounter Date: 10/30/2019   PT End of Session - 10/30/19 1423    Visit Number 5    Number of Visits 18    Date for PT Re-Evaluation 12/05/19    Authorization Type BCBS; Medicaid; FOTO (please complete on visit 2), Progress note every 10th visit    PT Start Time 0102    PT Stop Time 0141    PT Time Calculation (min) 39 min    Activity Tolerance Patient tolerated treatment well;Patient limited by pain    Behavior During Therapy [facility] for tasks assessed/performed           No past medical history on file.  No past surgical history on file.  There were no vitals filed for this visit.   Subjective Assessment - 10/30/19 1321    Subjective COVID-19 screen performed prior to patient entering clinic.  No new complaints.    Pertinent History RIGHT knee arthroscopy with allograft ACL reconstruction, allograft MCL reconstruction, partial lateral meniscectomy, and debridement/chondroplasty 10/10/2019, Asthma, HTN, Cholecystectomy, tubal ligation.    How long can you sit comfortably? short periods    Currently in Pain? Yes    Pain Score 6     Pain Orientation Right    Pain Descriptors / Indicators Aching;Sore    Pain Type Surgical pain    Pain Onset 1 to 4 weeks ago                             [facility] Adult PT Treatment/Exercise - 10/30/19 0001      Exercises   Exercises Knee/Hip      Knee/Hip Exercises: Aerobic   Nustep Level 1 moving seat forward x 2 over 15 minutes to gently increase right knee flexion.      Knee/Hip Exercises: Supine   Quad Sets Limitations 16  minutes quad sets facilitated with [place] electrical stimulation to quadriceps with 10 sec contractions  f/b a 10 sec rest.                    PT Short Term Goals - 10/17/19 1438      PT SHORT TERM GOAL #1   Title Patient will be independent with HEP    Baseline no knowledge of HEP    Time 4    Period Weeks    Status New      PT SHORT TERM GOAL #2   Title Patient will demonstrate 90+ degrees of right knee flexion PROM to improve ROM    Baseline 41 degrees right knee PROM    Time 4    Period Weeks    Status New      PT SHORT TERM GOAL #3   Title Patient will demonstrate 0 degrees of right knee extension PROM to improve ROM    Baseline 8 degrees from neutral right knee extension PROM    Time 4    Period Weeks    Status New             PT Long Term Goals - 10/17/19 1440      PT LONG TERM GOAL #1   Title Patient will be independent with advanced HEP    Baseline No knowledge  of appropriate advanced HEP    Time 6    Period Weeks    Status New      PT LONG TERM GOAL #2   Title Patient will demonstrate 0-120+ degrees of right knee extension/flexion AROM to improve ability to perform functional tasks.    Baseline 41 degrees right knee flexion PROM, 8 degrees from neutral right knee extension PROM; no active ROM per protocol    Time 6    Period Weeks    Status New      PT LONG TERM GOAL #3   Title Patient will ambulate with a SPC or no AD with minimal gait deviations and right knee pain less than or equal to 3/10.    Baseline ambulating with bilateral axillary crutches and NWB right knee pain average 8/10    Time 6    Period Weeks    Status New      PT LONG TERM GOAL #4   Title Patient will report ability to perform ADLs and home activities with right knee pain less than or equal to 4/10.    Baseline unable to perform ADLs and home activities without assistance.    Time 6    Period Weeks    Status New      PT LONG TERM GOAL #5   Title Patient will demonstrate reciprocating stair ambulations with one rail to safely access 2nd floor of home.    Baseline  unable at this time    Time 6    Period Weeks    Status New                 Plan - 10/30/19 1424    Clinical Impression Statement Patient did well today with flexion on Nustep to 70 degrees today.    Personal Factors and Comorbidities Comorbidity 3+;Time since onset of injury/illness/exacerbation    Comorbidities RIGHT knee arthroscopy with allograft ACL reconstruction, allograft MCL reconstruction, partial lateral meniscectomy, and debridement/chondroplasty 10/10/2019,Asthma, HTN, Cholecystectomy, tubal ligation.    Examination-Activity Limitations Locomotion Level;Other;Hygiene/Grooming;Dressing;Stand;Stairs;Squat;Sleep;Bend;Transfers;Bed Mobility    Examination-Participation Restrictions Community Activity;Other    Stability/Clinical Decision Making Stable/Uncomplicated    Rehab Potential Good    PT Frequency 3x / week    PT Duration 6 weeks    PT Treatment/Interventions ADLs/Self Care Home Management;Cryotherapy;Electrical Stimulation;Ultrasound;Moist Heat;Gait training;Stair training;Functional mobility training;Therapeutic activities;Therapeutic exercise;Neuromuscular re-education;Manual techniques;Patient/family education;Passive range of motion;Vasopneumatic Device;Balance training;Scar mobilization;Taping    PT Next Visit Plan FOTO please, PROM to right knee, modalities PRN for pain relief.    Consulted and Agree with Plan of Care Patient           Patient will benefit from skilled therapeutic intervention in order to improve the following deficits and impairments:  Abnormal gait, Difficulty walking, Decreased activity tolerance, Decreased strength, Increased edema, Decreased range of motion, Pain, Decreased balance  Visit Diagnosis: Stiffness of right knee, not elsewhere classified  Muscle weakness (generalized)  Acute pain of right knee     Problem List There are no problems to display for this patient.   Maxine Fredman, [place] MPT 10/30/2019, 2:26 PM  [facility] [address] [place], [place], [address] Phone: [phone]   Fax:  [phone]  Name: Jamie Doyle MRN: [id number] Date of Birth: 1975-08-16

## 2019-11-01 ENCOUNTER — Other Ambulatory Visit: Payer: Self-pay

## 2019-11-01 ENCOUNTER — Ambulatory Visit: Payer: BC Managed Care – PPO | Admitting: Physical Therapy

## 2019-11-01 ENCOUNTER — Encounter: Payer: Self-pay | Admitting: Physical Therapy

## 2019-11-01 DIAGNOSIS — M25561 Pain in right knee: Secondary | ICD-10-CM

## 2019-11-01 DIAGNOSIS — G8929 Other chronic pain: Secondary | ICD-10-CM | POA: Diagnosis not present

## 2019-11-01 DIAGNOSIS — M6281 Muscle weakness (generalized): Secondary | ICD-10-CM | POA: Diagnosis not present

## 2019-11-01 DIAGNOSIS — M25661 Stiffness of right knee, not elsewhere classified: Secondary | ICD-10-CM

## 2019-11-01 DIAGNOSIS — R600 Localized edema: Secondary | ICD-10-CM

## 2019-11-01 NOTE — Therapy (Signed)
[facility] [address] [place], [place], [address] Phone: [phone]   Fax:  [phone]  Physical Therapy Treatment  Patient Details  Name: Idora Brosious MRN: [id number] Date of Birth: 1975-12-04 Referring Provider (PT): Elinor Dodge, MD   Encounter Date: 11/01/2019   PT End of Session - 11/01/19 1204    Visit Number 6    Number of Visits 18    Date for PT Re-Evaluation 12/05/19    Authorization Type BCBS; Medicaid; FOTO (please complete on visit 2), Progress note every 10th visit    PT Start Time 1030    PT Stop Time 1119    PT Time Calculation (min) 49 min    Activity Tolerance Patient tolerated treatment well;Patient limited by pain    Behavior During Therapy [facility] for tasks assessed/performed           No past medical history on file.  No past surgical history on file.  There were no vitals filed for this visit.   Subjective Assessment - 11/01/19 1148    Subjective COVID-19 screen performed prior to patient entering clinic.  Working on bending at home.    Pertinent History RIGHT knee arthroscopy with allograft ACL reconstruction, allograft MCL reconstruction, partial lateral meniscectomy, and debridement/chondroplasty 10/10/2019, Asthma, HTN, Cholecystectomy, tubal ligation.    Limitations Sitting;Standing;Walking;House hold activities    How long can you sit comfortably? short periods    How long can you stand comfortably? short periods    How long can you walk comfortably? short periods    Diagnostic tests MRI confirmed MCL, ACL and meniscus tear    Patient Stated Goals Decrease pain, Get back to usual routine    Currently in Pain? Yes    Pain Score 6     Pain Location Knee    Pain Orientation Right    Pain Descriptors / Indicators Aching;Sore    Pain Type Surgical pain    Pain Onset 1 to 4 weeks ago              [facility] PT Assessment - 11/01/19 0001      PROM   Right Knee Flexion 70                          [facility] Adult PT Treatment/Exercise - 11/01/19 0001      Exercises   Exercises Knee/Hip      Knee/Hip Exercises: Aerobic   Nustep 15 minutes moving seat forward as tolerated.      Knee/Hip Exercises: Supine   Quad Sets Limitations 15 minutes facilitated with [place] electrical stimulation (4 electrodes) with 10 sec contractions f/b 10 sec rest.      Manual Therapy   Manual Therapy Passive ROM    Passive ROM In supine:  Gentle pROM to patient's right knee into flexion (10 minutes total).                    PT Short Term Goals - 10/17/19 1438      PT SHORT TERM GOAL #1   Title Patient will be independent with HEP    Baseline no knowledge of HEP    Time 4    Period Weeks    Status New      PT SHORT TERM GOAL #2   Title Patient will demonstrate 90+ degrees of right knee flexion PROM to improve ROM    Baseline 41 degrees right knee PROM    Time 4  Period Weeks    Status New      PT SHORT TERM GOAL #3   Title Patient will demonstrate 0 degrees of right knee extension PROM to improve ROM    Baseline 8 degrees from neutral right knee extension PROM    Time 4    Period Weeks    Status New             PT Long Term Goals - 10/17/19 1440      PT LONG TERM GOAL #1   Title Patient will be independent with advanced HEP    Baseline No knowledge of appropriate advanced HEP    Time 6    Period Weeks    Status New      PT LONG TERM GOAL #2   Title Patient will demonstrate 0-120+ degrees of right knee extension/flexion AROM to improve ability to perform functional tasks.    Baseline 41 degrees right knee flexion PROM, 8 degrees from neutral right knee extension PROM; no active ROM per protocol    Time 6    Period Weeks    Status New      PT LONG TERM GOAL #3   Title Patient will ambulate with a SPC or no AD with minimal gait deviations and right knee pain less than or equal to 3/10.    Baseline ambulating with bilateral axillary  crutches and NWB right knee pain average 8/10    Time 6    Period Weeks    Status New      PT LONG TERM GOAL #4   Title Patient will report ability to perform ADLs and home activities with right knee pain less than or equal to 4/10.    Baseline unable to perform ADLs and home activities without assistance.    Time 6    Period Weeks    Status New      PT LONG TERM GOAL #5   Title Patient will demonstrate reciprocating stair ambulations with one rail to safely access 2nd floor of home.    Baseline unable at this time    Time 6    Period Weeks    Status New                 Plan - 11/01/19 1206    Clinical Impression Statement Patient progressing well and able to achieve 70 degrees of passive right knee flexion today.    Personal Factors and Comorbidities Comorbidity 3+;Time since onset of injury/illness/exacerbation    Comorbidities RIGHT knee arthroscopy with allograft ACL reconstruction, allograft MCL reconstruction, partial lateral meniscectomy, and debridement/chondroplasty 10/10/2019,Asthma, HTN, Cholecystectomy, tubal ligation.    Examination-Activity Limitations Locomotion Level;Other;Hygiene/Grooming;Dressing;Stand;Stairs;Squat;Sleep;Bend;Transfers;Bed Mobility    Examination-Participation Restrictions Community Activity;Other    Stability/Clinical Decision Making Stable/Uncomplicated    Rehab Potential Good    PT Frequency 3x / week    PT Duration 6 weeks    PT Treatment/Interventions ADLs/Self Care Home Management;Cryotherapy;Electrical Stimulation;Ultrasound;Moist Heat;Gait training;Stair training;Functional mobility training;Therapeutic activities;Therapeutic exercise;Neuromuscular re-education;Manual techniques;Patient/family education;Passive range of motion;Vasopneumatic Device;Balance training;Scar mobilization;Taping    PT Next Visit Plan FOTO please, PROM to right knee, modalities PRN for pain relief.    Consulted and Agree with Plan of Care Patient            Patient will benefit from skilled therapeutic intervention in order to improve the following deficits and impairments:  Abnormal gait, Difficulty walking, Decreased activity tolerance, Decreased strength, Increased edema, Decreased range of motion, Pain, Decreased balance  Visit Diagnosis: Stiffness of  right knee, not elsewhere classified  Muscle weakness (generalized)  Acute pain of right knee  Localized edema  Chronic pain of right knee     Problem List There are no problems to display for this patient.   Larkin Morelos, [place] MPT 11/01/2019, 12:08 PM  [facility] [address] [place], [place], [address] Phone: [phone]   Fax:  [phone]  Name: Darryl Blumenstein MRN: [id number] Date of Birth: 04/30/1975

## 2019-11-05 ENCOUNTER — Encounter: Payer: Self-pay | Admitting: Physical Therapy

## 2019-11-05 ENCOUNTER — Ambulatory Visit: Payer: BC Managed Care – PPO | Admitting: Physical Therapy

## 2019-11-05 ENCOUNTER — Other Ambulatory Visit: Payer: Self-pay

## 2019-11-05 DIAGNOSIS — M25561 Pain in right knee: Secondary | ICD-10-CM | POA: Diagnosis not present

## 2019-11-05 DIAGNOSIS — M25661 Stiffness of right knee, not elsewhere classified: Secondary | ICD-10-CM

## 2019-11-05 DIAGNOSIS — G8929 Other chronic pain: Secondary | ICD-10-CM | POA: Diagnosis not present

## 2019-11-05 DIAGNOSIS — M6281 Muscle weakness (generalized): Secondary | ICD-10-CM | POA: Diagnosis not present

## 2019-11-05 DIAGNOSIS — R600 Localized edema: Secondary | ICD-10-CM | POA: Diagnosis not present

## 2019-11-05 NOTE — Therapy (Signed)
[facility] [address] [place], [place], [address] Phone: [phone]   Fax:  [phone]  Physical Therapy Treatment  Patient Details  Name: Jamie Doyle MRN: [id number] Date of Birth: [birth year] Referring Provider (PT): Elinor Dodge, MD   Encounter Date: 11/05/2019   PT End of Session - 11/05/19 1542    Visit Number 7    Number of Visits 18    Date for PT Re-Evaluation 12/05/19    Authorization Type BCBS; Medicaid; FOTO (please complete on visit 2), Progress note every 10th visit    PT Start Time 1519    PT Stop Time 1558    PT Time Calculation (min) 39 min    Equipment Utilized During Treatment Other (comment);Right knee immobilizer   B axillary crutches   Activity Tolerance Patient tolerated treatment well;Patient limited by pain    Behavior During Therapy [facility] for tasks assessed/performed           History reviewed. No pertinent past medical history.  History reviewed. No pertinent surgical history.  There were no vitals filed for this visit.   Subjective Assessment - 11/05/19 1518    Subjective COVID-19 screen performed prior to patient entering clinic. Reports more fluid retention which she has a history of. Compliant with heel slides and QS at home.    Pertinent History RIGHT knee arthroscopy with allograft ACL reconstruction, allograft MCL reconstruction, partial lateral meniscectomy, and debridement/chondroplasty 10/10/2019, Asthma, HTN, Cholecystectomy, tubal ligation.    Limitations Sitting;Standing;Walking;House hold activities    How long can you sit comfortably? short periods    How long can you stand comfortably? short periods    How long can you walk comfortably? short periods    Diagnostic tests MRI confirmed MCL, ACL and meniscus tear    Currently in Pain? Yes    Pain Score 7     Pain Location Knee    Pain Orientation Right    Pain Descriptors / Indicators Discomfort    Pain Type Surgical pain      Pain Onset 1 to 4 weeks ago    Pain Frequency Constant              [facility] PT Assessment - 11/05/19 0001      Assessment   Medical Diagnosis Complete tear of MCL of knee, right, initial encounter    Referring Provider (PT) Elinor Dodge, MD    Onset Date/Surgical Date 10/10/19    Next MD Visit 11/19/2019    Prior Therapy yes      Precautions   Precautions Knee    Precaution Comments per protocol MCL, ACL, and meniscus repair      Restrictions   Weight Bearing Restrictions Yes    RLE Weight Bearing Weight bearing as tolerated                         [facility] Adult PT Treatment/Exercise - 11/05/19 0001      Knee/Hip Exercises: Aerobic   Nustep L3, seat 8-7 x15 min      Knee/Hip Exercises: Supine   Quad Sets Limitations    Quad Sets Limitations NMR with russian stimulation     Heel Slides AAROM;Right;3 sets;10 reps    Hip Adduction Isometric Strengthening;Both;20 reps      Modalities   Modalities Geologist, engineering Location R VMO/Quad    Engineer, manufacturing Russian with QS    Electrical Stimulation Parameters  10/10, 50 bps, 5 sec ramp, 50% x15 min    Electrical Stimulation Goals Neuromuscular facilitation                    PT Short Term Goals - 10/17/19 1438      PT SHORT TERM GOAL #1   Title Patient will be independent with HEP    Baseline no knowledge of HEP    Time 4    Period Weeks    Status New      PT SHORT TERM GOAL #2   Title Patient will demonstrate 90+ degrees of right knee flexion PROM to improve ROM    Baseline 41 degrees right knee PROM    Time 4    Period Weeks    Status New      PT SHORT TERM GOAL #3   Title Patient will demonstrate 0 degrees of right knee extension PROM to improve ROM    Baseline 8 degrees from neutral right knee extension PROM    Time 4    Period Weeks    Status New             PT Long Term Goals - 10/17/19 1440       PT LONG TERM GOAL #1   Title Patient will be independent with advanced HEP    Baseline No knowledge of appropriate advanced HEP    Time 6    Period Weeks    Status New      PT LONG TERM GOAL #2   Title Patient will demonstrate 0-120+ degrees of right knee extension/flexion AROM to improve ability to perform functional tasks.    Baseline 41 degrees right knee flexion PROM, 8 degrees from neutral right knee extension PROM; no active ROM per protocol    Time 6    Period Weeks    Status New      PT LONG TERM GOAL #3   Title Patient will ambulate with a SPC or no AD with minimal gait deviations and right knee pain less than or equal to 3/10.    Baseline ambulating with bilateral axillary crutches and NWB right knee pain average 8/10    Time 6    Period Weeks    Status New      PT LONG TERM GOAL #4   Title Patient will report ability to perform ADLs and home activities with right knee pain less than or equal to 4/10.    Baseline unable to perform ADLs and home activities without assistance.    Time 6    Period Weeks    Status New      PT LONG TERM GOAL #5   Title Patient will demonstrate reciprocating stair ambulations with one rail to safely access 2nd floor of home.    Baseline unable at this time    Time 6    Period Weeks    Status New                 Plan - 11/05/19 1618    Clinical Impression Statement Patient presented in clinic with more edema in RLE but has not had any uptake in activity and is diligent with elevation. Patient reports diligence with HEP such as heel slides within ROM limitations and QS. Patient able to demonstrate a minimally improved QS of R quad. [place] stimulation for NMR continued to progress activation of R quad. Normal stimulation respons noted following removal of the modality.    Personal Factors and  Comorbidities Comorbidity 3+;Time since onset of injury/illness/exacerbation    Comorbidities RIGHT knee arthroscopy with allograft ACL  reconstruction, allograft MCL reconstruction, partial lateral meniscectomy, and debridement/chondroplasty 10/10/2019,Asthma, HTN, Cholecystectomy, tubal ligation.    Examination-Activity Limitations Locomotion Level;Other;Hygiene/Grooming;Dressing;Stand;Stairs;Squat;Sleep;Bend;Transfers;Bed Mobility    Examination-Participation Restrictions Community Activity;Other    Stability/Clinical Decision Making Stable/Uncomplicated    Rehab Potential Good    PT Frequency 3x / week    PT Duration 6 weeks    PT Treatment/Interventions ADLs/Self Care Home Management;Cryotherapy;Electrical Stimulation;Ultrasound;Moist Heat;Gait training;Stair training;Functional mobility training;Therapeutic activities;Therapeutic exercise;Neuromuscular re-education;Manual techniques;Patient/family education;Passive range of motion;Vasopneumatic Device;Balance training;Scar mobilization;Taping    PT Next Visit Plan FOTO please, PROM to right knee, modalities PRN for pain relief.    Consulted and Agree with Plan of Care Patient           Patient will benefit from skilled therapeutic intervention in order to improve the following deficits and impairments:  Abnormal gait, Difficulty walking, Decreased activity tolerance, Decreased strength, Increased edema, Decreased range of motion, Pain, Decreased balance  Visit Diagnosis: Stiffness of right knee, not elsewhere classified  Muscle weakness (generalized)  Acute pain of right knee  Localized edema     Problem List There are no problems to display for this patient.   Marvell Fuller, PTA 11/05/2019, 4:23 PM  [facility] [address] [place], [place], [address] Phone: [phone]   Fax:  [phone]  Name: Sujey Gundry MRN: [id number] Date of Birth: 1975/07/07

## 2019-11-08 ENCOUNTER — Ambulatory Visit: Payer: BC Managed Care – PPO | Admitting: Physical Therapy

## 2019-11-08 DIAGNOSIS — M6281 Muscle weakness (generalized): Secondary | ICD-10-CM | POA: Diagnosis not present

## 2019-11-08 DIAGNOSIS — M25561 Pain in right knee: Secondary | ICD-10-CM | POA: Diagnosis not present

## 2019-11-08 DIAGNOSIS — G8929 Other chronic pain: Secondary | ICD-10-CM | POA: Diagnosis not present

## 2019-11-08 DIAGNOSIS — M25661 Stiffness of right knee, not elsewhere classified: Secondary | ICD-10-CM | POA: Diagnosis not present

## 2019-11-08 DIAGNOSIS — R600 Localized edema: Secondary | ICD-10-CM | POA: Diagnosis not present

## 2019-11-08 NOTE — Therapy (Signed)
[facility] [address] [place], [place], [address] Phone: [phone]   Fax:  [phone]  Physical Therapy Treatment  Patient Details  Name: Jamie Doyle MRN: [id number] Date of Birth: 1975/06/10 Referring Provider (PT): Elinor Dodge, MD   Encounter Date: 11/08/2019   PT End of Session - 11/08/19 1327    Visit Number 8    Number of Visits 18    Date for PT Re-Evaluation 12/05/19    Authorization Type BCBS; Medicaid; FOTO (please complete on visit 2), Progress note every 10th visit    PT Start Time 1115    PT Stop Time 1204    PT Time Calculation (min) 49 min    Equipment Utilized During Treatment Other (comment);Right knee immobilizer    Activity Tolerance Patient tolerated treatment well;Patient limited by pain    Behavior During Therapy [facility] for tasks assessed/performed           No past medical history on file.  No past surgical history on file.  There were no vitals filed for this visit.   Subjective Assessment - 11/08/19 1328    Subjective COVID-19 screen performed prior to patient entering clinic.  No new complaints.  Weaning to one church at home.    Patient is accompained by: Interpreter    Pertinent History RIGHT knee arthroscopy with allograft ACL reconstruction, allograft MCL reconstruction, partial lateral meniscectomy, and debridement/chondroplasty 10/10/2019, Asthma, HTN, Cholecystectomy, tubal ligation.    Limitations Sitting;Standing;Walking;House hold activities    How long can you sit comfortably? short periods    How long can you stand comfortably? short periods    How long can you walk comfortably? short periods    Diagnostic tests MRI confirmed MCL, ACL and meniscus tear    Patient Stated Goals Decrease pain, Get back to usual routine    Currently in Pain? Yes    Pain Score 6     Pain Location Knee    Pain Orientation Right    Pain Descriptors / Indicators Discomfort    Pain Type Surgical  pain    Pain Onset 1 to 4 weeks ago                             [facility] Adult PT Treatment/Exercise - 11/08/19 0001      Exercises   Exercises Knee/Hip      Knee/Hip Exercises: Aerobic   Nustep Level 3 x 20 minutes monitored and moving seat forward x 3 to gently increase knee flexion.      Knee/Hip Exercises: Supine   Quad Sets Limitations right knee quad sets x 20 minutes facilitated with 4 electrode VMS with 10 sec contractions f/b 10 sec rest.                    PT Short Term Goals - 10/17/19 1438      PT SHORT TERM GOAL #1   Title Patient will be independent with HEP    Baseline no knowledge of HEP    Time 4    Period Weeks    Status New      PT SHORT TERM GOAL #2   Title Patient will demonstrate 90+ degrees of right knee flexion PROM to improve ROM    Baseline 41 degrees right knee PROM    Time 4    Period Weeks    Status New      PT SHORT TERM GOAL #3  Title Patient will demonstrate 0 degrees of right knee extension PROM to improve ROM    Baseline 8 degrees from neutral right knee extension PROM    Time 4    Period Weeks    Status New             PT Long Term Goals - 10/17/19 1440      PT LONG TERM GOAL #1   Title Patient will be independent with advanced HEP    Baseline No knowledge of appropriate advanced HEP    Time 6    Period Weeks    Status New      PT LONG TERM GOAL #2   Title Patient will demonstrate 0-120+ degrees of right knee extension/flexion AROM to improve ability to perform functional tasks.    Baseline 41 degrees right knee flexion PROM, 8 degrees from neutral right knee extension PROM; no active ROM per protocol    Time 6    Period Weeks    Status New      PT LONG TERM GOAL #3   Title Patient will ambulate with a SPC or no AD with minimal gait deviations and right knee pain less than or equal to 3/10.    Baseline ambulating with bilateral axillary crutches and NWB right knee pain average 8/10    Time 6     Period Weeks    Status New      PT LONG TERM GOAL #4   Title Patient will report ability to perform ADLs and home activities with right knee pain less than or equal to 4/10.    Baseline unable to perform ADLs and home activities without assistance.    Time 6    Period Weeks    Status New      PT LONG TERM GOAL #5   Title Patient will demonstrate reciprocating stair ambulations with one rail to safely access 2nd floor of home.    Baseline unable at this time    Time 6    Period Weeks    Status New                 Plan - 11/08/19 1334    Clinical Impression Statement Patient is progressing very well.  She is steadily and gently increasing her right knee flexion within protocol guidelines.  She is beginning to wean to one crutch at home and her brace donned.  She is able to perform a SLR without extensor lag.    Personal Factors and Comorbidities Comorbidity 3+;Time since onset of injury/illness/exacerbation    Comorbidities RIGHT knee arthroscopy with allograft ACL reconstruction, allograft MCL reconstruction, partial lateral meniscectomy, and debridement/chondroplasty 10/10/2019,Asthma, HTN, Cholecystectomy, tubal ligation.    Examination-Activity Limitations Locomotion Level;Other;Hygiene/Grooming;Dressing;Stand;Stairs;Squat;Sleep;Bend;Transfers;Bed Mobility    Examination-Participation Restrictions Community Activity;Other    Stability/Clinical Decision Making Stable/Uncomplicated    Rehab Potential Good    PT Frequency 3x / week    PT Duration 6 weeks    PT Treatment/Interventions ADLs/Self Care Home Management;Cryotherapy;Electrical Stimulation;Ultrasound;Moist Heat;Gait training;Stair training;Functional mobility training;Therapeutic activities;Therapeutic exercise;Neuromuscular re-education;Manual techniques;Patient/family education;Passive range of motion;Vasopneumatic Device;Balance training;Scar mobilization;Taping    PT Next Visit Plan FOTO please, PROM to right knee,  modalities PRN for pain relief.    Consulted and Agree with Plan of Care Patient           Patient will benefit from skilled therapeutic intervention in order to improve the following deficits and impairments:  Abnormal gait, Difficulty walking, Decreased activity tolerance, Decreased strength, Increased edema, Decreased  range of motion, Pain, Decreased balance  Visit Diagnosis: Stiffness of right knee, not elsewhere classified  Muscle weakness (generalized)  Acute pain of right knee  Localized edema  Chronic pain of right knee     Problem List There are no problems to display for this patient.   Naylin Burkle, [place] MPT 11/08/2019, 1:37 PM  [facility] [address] [place], [place], [address] Phone: [phone]   Fax:  [phone]  Name: Yarel Kilcrease MRN: [id number] Date of Birth: December 07, 1975

## 2019-11-12 ENCOUNTER — Ambulatory Visit: Payer: BC Managed Care – PPO | Admitting: Physical Therapy

## 2019-11-13 ENCOUNTER — Other Ambulatory Visit: Payer: Self-pay

## 2019-11-13 ENCOUNTER — Ambulatory Visit: Payer: BC Managed Care – PPO | Admitting: Physical Therapy

## 2019-11-13 DIAGNOSIS — M6281 Muscle weakness (generalized): Secondary | ICD-10-CM

## 2019-11-13 DIAGNOSIS — R600 Localized edema: Secondary | ICD-10-CM | POA: Diagnosis not present

## 2019-11-13 DIAGNOSIS — M25561 Pain in right knee: Secondary | ICD-10-CM

## 2019-11-13 DIAGNOSIS — M25661 Stiffness of right knee, not elsewhere classified: Secondary | ICD-10-CM | POA: Diagnosis not present

## 2019-11-13 DIAGNOSIS — G8929 Other chronic pain: Secondary | ICD-10-CM | POA: Diagnosis not present

## 2019-11-13 NOTE — Therapy (Signed)
[facility] [address] [place], [place], [address] Phone: [phone]   Fax:  [phone]  Physical Therapy Treatment  Patient Details  Name: Jamie Doyle MRN: [id number] Date of Birth: 09-10-1975 Referring Provider (PT): Elinor Dodge, MD   Encounter Date: 11/13/2019   PT End of Session - 11/13/19 1337    Visit Number 9    Number of Visits 18    Date for PT Re-Evaluation 12/05/19    Authorization Type BCBS; Medicaid; FOTO (please complete on visit 2), Progress note every 10th visit    PT Start Time 1030    PT Stop Time 1106    PT Time Calculation (min) 36 min    Activity Tolerance Patient tolerated treatment well;Patient limited by pain    Behavior During Therapy [facility] for tasks assessed/performed           No past medical history on file.  No past surgical history on file.  There were no vitals filed for this visit.   Subjective Assessment - 11/13/19 1203    Subjective COVID-19 screen performed prior to patient entering clinic.  Patient to the clinic today with brace donned and walking with one crutch.  Been working hard at home on exercise.    Pertinent History RIGHT knee arthroscopy with allograft ACL reconstruction, allograft MCL reconstruction, partial lateral meniscectomy, and debridement/chondroplasty 10/10/2019, Asthma, HTN, Cholecystectomy, tubal ligation.    Limitations Sitting;Standing;Walking;House hold activities    How long can you sit comfortably? short periods    How long can you stand comfortably? short periods    How long can you walk comfortably? short periods    Diagnostic tests MRI confirmed MCL, ACL and meniscus tear    Patient Stated Goals Decrease pain, Get back to usual routine    Currently in Pain? Yes    Pain Score 6     Pain Location Knee    Pain Orientation Right    Pain Descriptors / Indicators Discomfort    Pain Type Surgical pain    Pain Onset 1 to 4 weeks ago               [facility] PT Assessment - 11/13/19 0001      PROM   Right Knee Flexion 92                         [facility] Adult PT Treatment/Exercise - 11/13/19 0001      Exercises   Exercises Knee/Hip      Knee/Hip Exercises: Aerobic   Nustep Level 3 monitored and moving seat forward as tolerated to increase knee flexion x 20 minutes.      Knee/Hip Exercises: Supine   Other Supine Knee/Hip Exercises Supine wall slides x 3 minutes.      Manual Therapy   Manual Therapy Passive ROM    Passive ROM In supine:  Gentle PROM x 4 minutes to patient's right knee.                    PT Short Term Goals - 10/17/19 1438      PT SHORT TERM GOAL #1   Title Patient will be independent with HEP    Baseline no knowledge of HEP    Time 4    Period Weeks    Status New      PT SHORT TERM GOAL #2   Title Patient will demonstrate 90+ degrees of right knee flexion PROM to improve ROM  Baseline 41 degrees right knee PROM    Time 4    Period Weeks    Status New      PT SHORT TERM GOAL #3   Title Patient will demonstrate 0 degrees of right knee extension PROM to improve ROM    Baseline 8 degrees from neutral right knee extension PROM    Time 4    Period Weeks    Status New             PT Long Term Goals - 10/17/19 1440      PT LONG TERM GOAL #1   Title Patient will be independent with advanced HEP    Baseline No knowledge of appropriate advanced HEP    Time 6    Period Weeks    Status New      PT LONG TERM GOAL #2   Title Patient will demonstrate 0-120+ degrees of right knee extension/flexion AROM to improve ability to perform functional tasks.    Baseline 41 degrees right knee flexion PROM, 8 degrees from neutral right knee extension PROM; no active ROM per protocol    Time 6    Period Weeks    Status New      PT LONG TERM GOAL #3   Title Patient will ambulate with a SPC or no AD with minimal gait deviations and right knee pain less than or equal to 3/10.    Baseline  ambulating with bilateral axillary crutches and NWB right knee pain average 8/10    Time 6    Period Weeks    Status New      PT LONG TERM GOAL #4   Title Patient will report ability to perform ADLs and home activities with right knee pain less than or equal to 4/10.    Baseline unable to perform ADLs and home activities without assistance.    Time 6    Period Weeks    Status New      PT LONG TERM GOAL #5   Title Patient will demonstrate reciprocating stair ambulations with one rail to safely access 2nd floor of home.    Baseline unable at this time    Time 6    Period Weeks    Status New                 Plan - 11/13/19 1345    Clinical Impression Statement Patient is progressing well.  Right knee flexion to 92 degrees.  She has weaned herself to one crutch with brace donned.    Personal Factors and Comorbidities Comorbidity 3+;Time since onset of injury/illness/exacerbation    Comorbidities RIGHT knee arthroscopy with allograft ACL reconstruction, allograft MCL reconstruction, partial lateral meniscectomy, and debridement/chondroplasty 10/10/2019,Asthma, HTN, Cholecystectomy, tubal ligation.    Examination-Activity Limitations Locomotion Level;Other;Hygiene/Grooming;Dressing;Stand;Stairs;Squat;Sleep;Bend;Transfers;Bed Mobility    Examination-Participation Restrictions Community Activity;Other    Stability/Clinical Decision Making Stable/Uncomplicated    Rehab Potential Good    PT Frequency 3x / week    PT Duration 6 weeks    PT Treatment/Interventions ADLs/Self Care Home Management;Cryotherapy;Electrical Stimulation;Ultrasound;Moist Heat;Gait training;Stair training;Functional mobility training;Therapeutic activities;Therapeutic exercise;Neuromuscular re-education;Manual techniques;Patient/family education;Passive range of motion;Vasopneumatic Device;Balance training;Scar mobilization;Taping    PT Next Visit Plan FOTO please, PROM to right knee, modalities PRN for pain relief.     Consulted and Agree with Plan of Care Patient           Patient will benefit from skilled therapeutic intervention in order to improve the following deficits and impairments:  Abnormal gait,  Difficulty walking, Decreased activity tolerance, Decreased strength, Increased edema, Decreased range of motion, Pain, Decreased balance  Visit Diagnosis: Stiffness of right knee, not elsewhere classified  Muscle weakness (generalized)  Acute pain of right knee     Problem List There are no problems to display for this patient.   Elyna Pangilinan, [place] MPT 11/13/2019, 1:51 PM  [facility] [address] [place], [place], [address] Phone: [phone]   Fax:  [phone]  Name: Shivangi Lutz MRN: [id number] Date of Birth: 1975-11-15

## 2019-11-15 ENCOUNTER — Other Ambulatory Visit: Payer: Self-pay

## 2019-11-15 ENCOUNTER — Ambulatory Visit: Payer: BC Managed Care – PPO | Admitting: Physical Therapy

## 2019-11-15 ENCOUNTER — Encounter: Payer: Self-pay | Admitting: Physical Therapy

## 2019-11-15 DIAGNOSIS — M25561 Pain in right knee: Secondary | ICD-10-CM

## 2019-11-15 DIAGNOSIS — R600 Localized edema: Secondary | ICD-10-CM

## 2019-11-15 DIAGNOSIS — M25661 Stiffness of right knee, not elsewhere classified: Secondary | ICD-10-CM | POA: Diagnosis not present

## 2019-11-15 DIAGNOSIS — M6281 Muscle weakness (generalized): Secondary | ICD-10-CM

## 2019-11-15 DIAGNOSIS — G8929 Other chronic pain: Secondary | ICD-10-CM | POA: Diagnosis not present

## 2019-11-15 NOTE — Therapy (Addendum)
[facility] [address] [place], [place], [address] Phone: [phone]   Fax:  [phone]  Physical Therapy Treatment  Patient Details  Name: Jamie Doyle MRN: [id number] Date of Birth: 30-Mar-1975 Referring Provider (PT): Elinor Dodge, MD   Encounter Date: 11/15/2019   PT End of Session - 11/15/19 1037    Visit Number 10    Number of Visits 18    Date for PT Re-Evaluation 12/05/19    Authorization Type BCBS; Medicaid; FOTO (please complete on visit 2), Progress note every 10th visit    PT Start Time 1032    PT Stop Time 1114    PT Time Calculation (min) 42 min    Equipment Utilized During Treatment Other (comment);Right knee immobilizer    Activity Tolerance Patient tolerated treatment well;Patient limited by pain    Behavior During Therapy [facility] for tasks assessed/performed           History reviewed. No pertinent past medical history.  History reviewed. No pertinent surgical history.  There were no vitals filed for this visit.   Subjective Assessment - 11/15/19 1035    Subjective COVID 19 screening performed on patient upon arrival. Patient reports she has started sleeping in her bed again this week.    Pertinent History RIGHT knee arthroscopy with allograft ACL reconstruction, allograft MCL reconstruction, partial lateral meniscectomy, and debridement/chondroplasty 10/10/2019, Asthma, HTN, Cholecystectomy, tubal ligation.    Limitations Sitting;Standing;Walking;House hold activities    How long can you sit comfortably? short periods    How long can you stand comfortably? short periods    How long can you walk comfortably? short periods    Diagnostic tests MRI confirmed MCL, ACL and meniscus tear    Patient Stated Goals Decrease pain, Get back to usual routine    Currently in Pain? No/denies              [facility] PT Assessment - 11/15/19 0001      Assessment   Medical Diagnosis Complete tear of MCL of knee,  right, initial encounter    Referring Provider (PT) Elinor Dodge, MD    Onset Date/Surgical Date 10/10/19    Next MD Visit 11/19/2019    Prior Therapy yes      Precautions   Precautions Knee    Precaution Comments per protocol MCL, ACL, and meniscus repair      Restrictions   Weight Bearing Restrictions Yes                         [facility] Adult PT Treatment/Exercise - 11/15/19 0001      Knee/Hip Exercises: Aerobic   Nustep L1, seat 6 x15 min for ROM      Knee/Hip Exercises: Seated   Heel Slides AAROM;Right;3 sets;10 reps    Other Seated Knee/Hip Exercises B heel/toe raises x30 reps each      Knee/Hip Exercises: Supine   Short Arc Quad Sets AROM;Right;3 sets;10 reps      Manual Therapy   Manual Therapy Passive ROM    Passive ROM PROM of R knee into flexion gently                     PT Short Term Goals - 10/17/19 1438      PT SHORT TERM GOAL #1   Title Patient will be independent with HEP    Baseline no knowledge of HEP    Time 4    Period Weeks  Status New      PT SHORT TERM GOAL #2   Title Patient will demonstrate 90+ degrees of right knee flexion PROM to improve ROM    Baseline 41 degrees right knee PROM    Time 4    Period Weeks    Status New      PT SHORT TERM GOAL #3   Title Patient will demonstrate 0 degrees of right knee extension PROM to improve ROM    Baseline 8 degrees from neutral right knee extension PROM    Time 4    Period Weeks    Status New             PT Long Term Goals - 10/17/19 1440      PT LONG TERM GOAL #1   Title Patient will be independent with advanced HEP    Baseline No knowledge of appropriate advanced HEP    Time 6    Period Weeks    Status New      PT LONG TERM GOAL #2   Title Patient will demonstrate 0-120+ degrees of right knee extension/flexion AROM to improve ability to perform functional tasks.    Baseline 41 degrees right knee flexion PROM, 8 degrees from neutral right knee  extension PROM; no active ROM per protocol    Time 6    Period Weeks    Status New      PT LONG TERM GOAL #3   Title Patient will ambulate with a SPC or no AD with minimal gait deviations and right knee pain less than or equal to 3/10.    Baseline ambulating with bilateral axillary crutches and NWB right knee pain average 8/10    Time 6    Period Weeks    Status New      PT LONG TERM GOAL #4   Title Patient will report ability to perform ADLs and home activities with right knee pain less than or equal to 4/10.    Baseline unable to perform ADLs and home activities without assistance.    Time 6    Period Weeks    Status New      PT LONG TERM GOAL #5   Title Patient will demonstrate reciprocating stair ambulations with one rail to safely access 2nd floor of home.    Baseline unable at this time    Time 6    Period Weeks    Status New                 Plan - 11/15/19 1229    Clinical Impression Statement Patient able to begin noticing improvement with daily life and therex. Patient encouraged to take her time with reps for stretch and strengthening due to weakness. R quad and calf weakness and muscle deterioration noted during treatment. Patient still compliant with brace use as well as a unilateral crutch along with HEP.    Personal Factors and Comorbidities Comorbidity 3+;Time since onset of injury/illness/exacerbation    Comorbidities RIGHT knee arthroscopy with allograft ACL reconstruction, allograft MCL reconstruction, partial lateral meniscectomy, and debridement/chondroplasty 10/10/2019,Asthma, HTN, Cholecystectomy, tubal ligation.    Examination-Activity Limitations Locomotion Level;Other;Hygiene/Grooming;Dressing;Stand;Stairs;Squat;Sleep;Bend;Transfers;Bed Mobility    Examination-Participation Restrictions Community Activity;Other    Stability/Clinical Decision Making Stable/Uncomplicated    Rehab Potential Good    PT Frequency 3x / week    PT Duration 6 weeks    PT  Treatment/Interventions ADLs/Self Care Home Management;Cryotherapy;Electrical Stimulation;Ultrasound;Moist Heat;Gait training;Stair training;Functional mobility training;Therapeutic activities;Therapeutic exercise;Neuromuscular re-education;Manual techniques;Patient/family education;Passive range of motion;Vasopneumatic  Device;Balance training;Scar mobilization;Taping    PT Next Visit Plan FOTO please, PROM to right knee, modalities PRN for pain relief.    Consulted and Agree with Plan of Care Patient           Patient will benefit from skilled therapeutic intervention in order to improve the following deficits and impairments:  Abnormal gait, Difficulty walking, Decreased activity tolerance, Decreased strength, Increased edema, Decreased range of motion, Pain, Decreased balance  Visit Diagnosis: Stiffness of right knee, not elsewhere classified  Muscle weakness (generalized)  Acute pain of right knee  Localized edema     Problem List There are no problems to display for this patient.   Marvell Fuller, PTA 11/15/2019, 12:32 PM  [facility] [address] [place], [place], [address] Phone: [phone]   Fax:  [phone]  Name: Hailea Eaglin MRN: [id number] Date of Birth: 1975/12/24  Progress Note Reporting Period 10/17/19 to 11/15/19  See note below for Objective Data and Assessment of Progress/Goals. Patient is progressing well.  She is compliant to using her right knee brace.  She has weaned herself to one crutch and her right knee flexion has improved significantly since beginning PT.    [place] MPT

## 2019-11-19 ENCOUNTER — Ambulatory Visit: Payer: BC Managed Care – PPO | Admitting: Physical Therapy

## 2019-11-19 ENCOUNTER — Other Ambulatory Visit: Payer: Self-pay

## 2019-11-19 DIAGNOSIS — M25661 Stiffness of right knee, not elsewhere classified: Secondary | ICD-10-CM | POA: Diagnosis not present

## 2019-11-19 DIAGNOSIS — R600 Localized edema: Secondary | ICD-10-CM | POA: Diagnosis not present

## 2019-11-19 DIAGNOSIS — M6281 Muscle weakness (generalized): Secondary | ICD-10-CM

## 2019-11-19 DIAGNOSIS — G8929 Other chronic pain: Secondary | ICD-10-CM | POA: Diagnosis not present

## 2019-11-19 DIAGNOSIS — M25561 Pain in right knee: Secondary | ICD-10-CM | POA: Diagnosis not present

## 2019-11-19 NOTE — Therapy (Signed)
[facility] [address] [place], [place], [address] Phone: [phone]   Fax:  [phone]  Physical Therapy Treatment  Patient Details  Name: Jamie Doyle MRN: [id number] Date of Birth: 07-25-1975 Referring Provider (PT): Elinor Dodge, MD   Encounter Date: 11/19/2019   PT End of Session - 11/19/19 1109    Visit Number 11    Number of Visits 18    Date for PT Re-Evaluation 12/05/19    Authorization Type BCBS; Medicaid; FOTO (please complete on visit 2), Progress note every 10th visit    PT Start Time 1030    PT Stop Time 1101    PT Time Calculation (min) 31 min    Activity Tolerance Patient tolerated treatment well;Patient limited by pain    Behavior During Therapy [facility] for tasks assessed/performed           No past medical history on file.  No past surgical history on file.  There were no vitals filed for this visit.   Subjective Assessment - 11/19/19 1104    Subjective COVID-19 screen performed prior to patient entering clinic.  Continuing to do home exercises.    Pertinent History RIGHT knee arthroscopy with allograft ACL reconstruction, allograft MCL reconstruction, partial lateral meniscectomy, and debridement/chondroplasty 10/10/2019, Asthma, HTN, Cholecystectomy, tubal ligation.    Limitations Sitting;Standing;Walking;House hold activities    How long can you sit comfortably? short periods    How long can you stand comfortably? short periods    How long can you walk comfortably? short periods    Diagnostic tests MRI confirmed MCL, ACL and meniscus tear    Patient Stated Goals Decrease pain, Get back to usual routine    Currently in Pain? Yes    Pain Score 5     Pain Location Knee    Pain Orientation Right    Pain Descriptors / Indicators Discomfort    Pain Type Surgical pain    Pain Onset 1 to 4 weeks ago              [facility] PT Assessment - 11/19/19 0001      PROM   Right Knee Flexion 100                          [facility] Adult PT Treatment/Exercise - 11/19/19 0001      Exercises   Exercises Knee/Hip      Knee/Hip Exercises: Aerobic   Nustep Level 1 x 15 minutes moving seat forward as tolerated to increase knee flexion.      Knee/Hip Exercises: Supine   Other Supine Knee/Hip Exercises Supine wall slides x 6 minutes f/b passive right knee low load long stretch x 2 minutes.                    PT Short Term Goals - 10/17/19 1438      PT SHORT TERM GOAL #1   Title Patient will be independent with HEP    Baseline no knowledge of HEP    Time 4    Period Weeks    Status New      PT SHORT TERM GOAL #2   Title Patient will demonstrate 90+ degrees of right knee flexion PROM to improve ROM    Baseline 41 degrees right knee PROM    Time 4    Period Weeks    Status New      PT SHORT TERM GOAL #3   Title Patient  will demonstrate 0 degrees of right knee extension PROM to improve ROM    Baseline 8 degrees from neutral right knee extension PROM    Time 4    Period Weeks    Status New             PT Long Term Goals - 10/17/19 1440      PT LONG TERM GOAL #1   Title Patient will be independent with advanced HEP    Baseline No knowledge of appropriate advanced HEP    Time 6    Period Weeks    Status New      PT LONG TERM GOAL #2   Title Patient will demonstrate 0-120+ degrees of right knee extension/flexion AROM to improve ability to perform functional tasks.    Baseline 41 degrees right knee flexion PROM, 8 degrees from neutral right knee extension PROM; no active ROM per protocol    Time 6    Period Weeks    Status New      PT LONG TERM GOAL #3   Title Patient will ambulate with a SPC or no AD with minimal gait deviations and right knee pain less than or equal to 3/10.    Baseline ambulating with bilateral axillary crutches and NWB right knee pain average 8/10    Time 6    Period Weeks    Status New      PT LONG TERM GOAL #4   Title Patient  will report ability to perform ADLs and home activities with right knee pain less than or equal to 4/10.    Baseline unable to perform ADLs and home activities without assistance.    Time 6    Period Weeks    Status New      PT LONG TERM GOAL #5   Title Patient will demonstrate reciprocating stair ambulations with one rail to safely access 2nd floor of home.    Baseline unable at this time    Time 6    Period Weeks    Status New                 Plan - 11/19/19 1110    Clinical Impression Statement Patient achieving 100 degrees of right knee flexion today.    Personal Factors and Comorbidities Comorbidity 3+;Time since onset of injury/illness/exacerbation    Comorbidities RIGHT knee arthroscopy with allograft ACL reconstruction, allograft MCL reconstruction, partial lateral meniscectomy, and debridement/chondroplasty 10/10/2019,Asthma, HTN, Cholecystectomy, tubal ligation.    Examination-Activity Limitations Locomotion Level;Other;Hygiene/Grooming;Dressing;Stand;Stairs;Squat;Sleep;Bend;Transfers;Bed Mobility    Examination-Participation Restrictions Community Activity;Other    Rehab Potential Good           Patient will benefit from skilled therapeutic intervention in order to improve the following deficits and impairments:  Abnormal gait, Difficulty walking, Decreased activity tolerance, Decreased strength, Increased edema, Decreased range of motion, Pain, Decreased balance  Visit Diagnosis: Stiffness of right knee, not elsewhere classified  Muscle weakness (generalized)     Problem List There are no problems to display for this patient.   Kazue Cerro, [place] MPT 11/19/2019, 11:11 AM  [facility] [address] [place], [place], [address] Phone: [phone]   Fax:  [phone]  Name: Tiye Huwe MRN: [id number] Date of Birth: [birth year]

## 2019-11-20 DIAGNOSIS — M545 Low back pain, unspecified: Secondary | ICD-10-CM | POA: Diagnosis not present

## 2019-11-20 DIAGNOSIS — Z881 Allergy status to other antibiotic agents status: Secondary | ICD-10-CM | POA: Diagnosis not present

## 2019-11-20 DIAGNOSIS — Z79899 Other long term (current) drug therapy: Secondary | ICD-10-CM | POA: Diagnosis not present

## 2019-11-20 DIAGNOSIS — Z885 Allergy status to narcotic agent status: Secondary | ICD-10-CM | POA: Diagnosis not present

## 2019-11-20 DIAGNOSIS — Z20822 Contact with and (suspected) exposure to covid-19: Secondary | ICD-10-CM | POA: Diagnosis not present

## 2019-11-20 DIAGNOSIS — N12 Tubulo-interstitial nephritis, not specified as acute or chronic: Secondary | ICD-10-CM | POA: Diagnosis not present

## 2019-11-20 DIAGNOSIS — Z975 Presence of (intrauterine) contraceptive device: Secondary | ICD-10-CM | POA: Diagnosis not present

## 2019-11-20 DIAGNOSIS — J45909 Unspecified asthma, uncomplicated: Secondary | ICD-10-CM | POA: Diagnosis not present

## 2019-11-22 ENCOUNTER — Encounter: Payer: BC Managed Care – PPO | Admitting: *Deleted

## 2019-11-23 DIAGNOSIS — U071 COVID-19: Secondary | ICD-10-CM | POA: Diagnosis not present

## 2019-11-26 ENCOUNTER — Ambulatory Visit: Payer: BC Managed Care – PPO | Attending: Sports Medicine | Admitting: Physical Therapy

## 2019-11-26 ENCOUNTER — Other Ambulatory Visit: Payer: Self-pay

## 2019-11-26 ENCOUNTER — Encounter: Payer: Self-pay | Admitting: Physical Therapy

## 2019-11-26 DIAGNOSIS — G8929 Other chronic pain: Secondary | ICD-10-CM | POA: Insufficient documentation

## 2019-11-26 DIAGNOSIS — R600 Localized edema: Secondary | ICD-10-CM | POA: Insufficient documentation

## 2019-11-26 DIAGNOSIS — M25661 Stiffness of right knee, not elsewhere classified: Secondary | ICD-10-CM | POA: Diagnosis not present

## 2019-11-26 DIAGNOSIS — M6281 Muscle weakness (generalized): Secondary | ICD-10-CM | POA: Insufficient documentation

## 2019-11-26 DIAGNOSIS — M25561 Pain in right knee: Secondary | ICD-10-CM | POA: Diagnosis not present

## 2019-11-26 NOTE — Therapy (Signed)
[facility] [address] [place], [place], [address] Phone: [phone]   Fax:  [phone]  Physical Therapy Treatment  Patient Details  Name: Jamie Doyle MRN: [id number] Date of Birth: 1975-04-17 Referring Provider (PT): Elinor Dodge, MD   Encounter Date: 11/26/2019   PT End of Session - 11/26/19 1610    Visit Number 12    Number of Visits 18    Date for PT Re-Evaluation 12/05/19    Authorization Type BCBS; Medicaid; FOTO (please complete on visit 2), Progress note every 10th visit    PT Start Time 1603    PT Stop Time 1647    PT Time Calculation (min) 44 min    Equipment Utilized During Treatment Right knee immobilizer    Activity Tolerance Patient tolerated treatment well    Behavior During Therapy [facility] for tasks assessed/performed           History reviewed. No pertinent past medical history.  History reviewed. No pertinent surgical history.  There were no vitals filed for this visit.   Subjective Assessment - 11/26/19 1607    Subjective COVID-19 screen performed prior to patient entering clinic.  Patient states that MD was very pleased with her progress and released her from crutches and unlocked her brace fully. Patient still unable to drive.    Pertinent History RIGHT knee arthroscopy with allograft ACL reconstruction, allograft MCL reconstruction, partial lateral meniscectomy, and debridement/chondroplasty 10/10/2019, Asthma, HTN, Cholecystectomy, tubal ligation.    Limitations Sitting;Standing;Walking;House hold activities    How long can you sit comfortably? short periods    How long can you stand comfortably? short periods    How long can you walk comfortably? short periods    Diagnostic tests MRI confirmed MCL, ACL and meniscus tear    Patient Stated Goals Decrease pain, Get back to usual routine    Currently in Pain? Yes    Pain Score 3     Pain Location Knee    Pain Orientation Right    Pain  Descriptors / Indicators Aching    Pain Type Surgical pain    Pain Onset More than a month ago    Pain Frequency Constant              [facility] PT Assessment - 11/26/19 0001      Assessment   Medical Diagnosis Complete tear of MCL of knee, right, initial encounter    Referring Provider (PT) Elinor Dodge, MD    Onset Date/Surgical Date 10/10/19    Next MD Visit 12/31/2019    Prior Therapy yes      Precautions   Precautions Knee    Precaution Comments per protocol MCL, ACL, and meniscus repair      ROM / Strength   AROM / PROM / Strength AROM      AROM   Overall AROM  Within functional limits for tasks performed    AROM Assessment Site Knee    Right/Left Knee Right    Right Knee Flexion 120                         [facility] Adult PT Treatment/Exercise - 11/26/19 0001      Knee/Hip Exercises: Aerobic   Nustep L3 x20 min for ROM at seat 8      Knee/Hip Exercises: Supine   Short Arc Quad Sets Strengthening;Right;3 sets;10 reps    Terminal Knee Extension Strengthening;Right;3 sets;10 reps;Theraband    Theraband Level (Terminal Knee Extension)  Level 1 (Yellow)    Hip Adduction Isometric Strengthening;Both;20 reps    Straight Leg Raises Strengthening;Right;3 sets;10 reps      Manual Therapy   Manual Therapy Passive ROM    Passive ROM PROM of R knee into flexion gently                     PT Short Term Goals - 11/26/19 1713      PT SHORT TERM GOAL #1   Title Patient will be independent with HEP    Baseline no knowledge of HEP    Time 4    Period Weeks    Status Achieved      PT SHORT TERM GOAL #2   Title Patient will demonstrate 90+ degrees of right knee flexion PROM to improve ROM    Baseline 41 degrees right knee PROM    Time 4    Period Weeks    Status Achieved      PT SHORT TERM GOAL #3   Title Patient will demonstrate 0 degrees of right knee extension PROM to improve ROM    Baseline 8 degrees from neutral right knee extension  PROM    Time 4    Period Weeks    Status Achieved             PT Long Term Goals - 11/26/19 1713      PT LONG TERM GOAL #1   Title Patient will be independent with advanced HEP    Baseline No knowledge of appropriate advanced HEP    Time 6    Period Weeks    Status On-going      PT LONG TERM GOAL #2   Title Patient will demonstrate 0-120+ degrees of right knee extension/flexion AROM to improve ability to perform functional tasks.    Baseline 41 degrees right knee flexion PROM, 8 degrees from neutral right knee extension PROM; no active ROM per protocol    Time 6    Period Weeks    Status Achieved      PT LONG TERM GOAL #3   Title Patient will ambulate with a SPC or no AD with minimal gait deviations and right knee pain less than or equal to 3/10.    Baseline ambulating with bilateral axillary crutches and NWB right knee pain average 8/10    Time 6    Period Weeks    Status Achieved      PT LONG TERM GOAL #4   Title Patient will report ability to perform ADLs and home activities with right knee pain less than or equal to 4/10.    Baseline unable to perform ADLs and home activities without assistance.    Time 6    Period Weeks    Status On-going      PT LONG TERM GOAL #5   Title Patient will demonstrate reciprocating stair ambulations with one rail to safely access 2nd floor of home.    Baseline unable at this time    Time 6    Period Weeks    Status On-going                 Plan - 11/26/19 1705    Clinical Impression Statement Patient presented in clinic with high spirits following great MD appointment in which she has weaned from crutches and brace has been unlocked. Patient progressed per ROM and progressed for quad strengthening as well. AROM of R knee measured as 120 deg without brace.  Patient able to demonstrate with good quad control although deficient with strength.    Personal Factors and Comorbidities Comorbidity 3+;Time since onset of  injury/illness/exacerbation    Comorbidities RIGHT knee arthroscopy with allograft ACL reconstruction, allograft MCL reconstruction, partial lateral meniscectomy, and debridement/chondroplasty 10/10/2019,Asthma, HTN, Cholecystectomy, tubal ligation.    Examination-Activity Limitations Locomotion Level;Other;Hygiene/Grooming;Dressing;Stand;Stairs;Squat;Sleep;Bend;Transfers;Bed Mobility    Examination-Participation Restrictions Community Activity;Other    Stability/Clinical Decision Making Stable/Uncomplicated    Rehab Potential Good    PT Frequency 3x / week    PT Duration 6 weeks    PT Treatment/Interventions ADLs/Self Care Home Management;Cryotherapy;Electrical Stimulation;Ultrasound;Moist Heat;Gait training;Stair training;Functional mobility training;Therapeutic activities;Therapeutic exercise;Neuromuscular re-education;Manual techniques;Patient/family education;Passive range of motion;Vasopneumatic Device;Balance training;Scar mobilization;Taping    PT Next Visit Plan FOTO please, PROM to right knee, modalities PRN for pain relief.    Consulted and Agree with Plan of Care Patient           Patient will benefit from skilled therapeutic intervention in order to improve the following deficits and impairments:  Abnormal gait, Difficulty walking, Decreased activity tolerance, Decreased strength, Increased edema, Decreased range of motion, Pain, Decreased balance  Visit Diagnosis: Stiffness of right knee, not elsewhere classified  Muscle weakness (generalized)  Acute pain of right knee  Localized edema     Problem List There are no problems to display for this patient.   Marvell Fuller, PTA 11/26/2019, 5:17 PM  [facility] [address] [place], [place], [address] Phone: [phone]   Fax:  [phone]  Name: Jamie Doyle MRN: [id number] Date of Birth: [birth year]

## 2019-11-29 ENCOUNTER — Ambulatory Visit: Payer: BC Managed Care – PPO | Admitting: Physical Therapy

## 2019-11-29 ENCOUNTER — Other Ambulatory Visit: Payer: Self-pay

## 2019-11-29 DIAGNOSIS — M6281 Muscle weakness (generalized): Secondary | ICD-10-CM

## 2019-11-29 DIAGNOSIS — M25561 Pain in right knee: Secondary | ICD-10-CM

## 2019-11-29 DIAGNOSIS — R600 Localized edema: Secondary | ICD-10-CM | POA: Diagnosis not present

## 2019-11-29 DIAGNOSIS — M25661 Stiffness of right knee, not elsewhere classified: Secondary | ICD-10-CM

## 2019-11-29 DIAGNOSIS — G8929 Other chronic pain: Secondary | ICD-10-CM | POA: Diagnosis not present

## 2019-11-29 NOTE — Therapy (Signed)
[facility] [address] [place], [place], [address] Phone: [phone]   Fax:  [phone]  Physical Therapy Treatment  Patient Details  Name: Jamie Doyle MRN: [id number] Date of Birth: 1975/08/20 Referring Provider (PT): Elinor Dodge, MD   Encounter Date: 11/29/2019   PT End of Session - 11/29/19 1127    Visit Number 13    Number of Visits 18    Date for PT Re-Evaluation 12/05/19    Authorization Type BCBS; Medicaid, Progress note every 10th visit    PT Start Time 1117    PT Stop Time 1159    PT Time Calculation (min) 42 min    Activity Tolerance Patient tolerated treatment well    Behavior During Therapy [facility] for tasks assessed/performed           No past medical history on file.  No past surgical history on file.  There were no vitals filed for this visit.   Subjective Assessment - 11/29/19 1120    Subjective COVID-19 screen performed prior to patient entering clinic.  Patient arrived with some discomfort.    Pertinent History RIGHT knee arthroscopy with allograft ACL reconstruction, allograft MCL reconstruction, partial lateral meniscectomy, and debridement/chondroplasty 10/10/2019, Asthma, HTN, Cholecystectomy, tubal ligation.    Limitations Sitting;Standing;Walking;House hold activities    How long can you sit comfortably? short periods    How long can you stand comfortably? short periods    How long can you walk comfortably? short periods    Diagnostic tests MRI confirmed MCL, ACL and meniscus tear    Patient Stated Goals Decrease pain, Get back to usual routine    Currently in Pain? Yes    Pain Score 5     Pain Location Knee    Pain Orientation Right    Pain Descriptors / Indicators Aching    Pain Onset More than a month ago    Pain Frequency Constant    Aggravating Factors  movement of knee    Pain Relieving Factors rest                             [facility] Adult PT Treatment/Exercise  - 11/29/19 0001      Knee/Hip Exercises: Aerobic   Nustep L3 x16min UE/LE      Knee/Hip Exercises: Supine   Short Arc Quad Sets Strengthening;Right;3 sets;10 reps    Terminal Knee Extension Strengthening;Right;3 sets;10 reps;Theraband    Theraband Level (Terminal Knee Extension) Level 1 (Yellow)    Hip Adduction Isometric Strengthening;Both;20 reps    Straight Leg Raises Strengthening;Right;3 sets;10 reps      Manual Therapy   Manual Therapy Passive ROM    Passive ROM PROM of R knee into flexion gently to improve and maintain mobility                  PT Education - 11/29/19 1202    Education Details yellow t-band issued for HEP TKA supine or standing progression    Person(s) Educated Patient    Methods Explanation;Demonstration    Comprehension Verbalized understanding            PT Short Term Goals - 11/26/19 1713      PT SHORT TERM GOAL #1   Title Patient will be independent with HEP    Baseline no knowledge of HEP    Time 4    Period Weeks    Status Achieved      PT SHORT  TERM GOAL #2   Title Patient will demonstrate 90+ degrees of right knee flexion PROM to improve ROM    Baseline 41 degrees right knee PROM    Time 4    Period Weeks    Status Achieved      PT SHORT TERM GOAL #3   Title Patient will demonstrate 0 degrees of right knee extension PROM to improve ROM    Baseline 8 degrees from neutral right knee extension PROM    Time 4    Period Weeks    Status Achieved             PT Long Term Goals - 11/26/19 1713      PT LONG TERM GOAL #1   Title Patient will be independent with advanced HEP    Baseline No knowledge of appropriate advanced HEP    Time 6    Period Weeks    Status On-going      PT LONG TERM GOAL #2   Title Patient will demonstrate 0-120+ degrees of right knee extension/flexion AROM to improve ability to perform functional tasks.    Baseline 41 degrees right knee flexion PROM, 8 degrees from neutral right knee extension  PROM; no active ROM per protocol    Time 6    Period Weeks    Status Achieved      PT LONG TERM GOAL #3   Title Patient will ambulate with a SPC or no AD with minimal gait deviations and right knee pain less than or equal to 3/10.    Baseline ambulating with bilateral axillary crutches and NWB right knee pain average 8/10    Time 6    Period Weeks    Status Achieved      PT LONG TERM GOAL #4   Title Patient will report ability to perform ADLs and home activities with right knee pain less than or equal to 4/10.    Baseline unable to perform ADLs and home activities without assistance.    Time 6    Period Weeks    Status On-going      PT LONG TERM GOAL #5   Title Patient will demonstrate reciprocating stair ambulations with one rail to safely access 2nd floor of home.    Baseline unable at this time    Time 6    Period Weeks    Status On-going                 Plan - 11/29/19 1136    Clinical Impression Statement Patient tolerated treatment well today. Patient continues to report improvement with mobility and able to stand with greater ease. Patient progressing with ROM and quad strengthening. Patient reported doing HEP daily. Patient goals progressing.    Personal Factors and Comorbidities Comorbidity 3+;Time since onset of injury/illness/exacerbation    Comorbidities RIGHT knee arthroscopy with allograft ACL reconstruction, allograft MCL reconstruction, partial lateral meniscectomy, and debridement/chondroplasty 10/10/2019,Asthma, HTN, Cholecystectomy, tubal ligation.    Examination-Activity Limitations Locomotion Level;Other;Hygiene/Grooming;Dressing;Stand;Stairs;Squat;Sleep;Bend;Transfers;Bed Mobility    Examination-Participation Restrictions Community Activity;Other    Stability/Clinical Decision Making Stable/Uncomplicated    Rehab Potential Good    PT Frequency 3x / week    PT Duration 6 weeks    PT Treatment/Interventions ADLs/Self Care Home  Management;Cryotherapy;Electrical Stimulation;Ultrasound;Moist Heat;Gait training;Stair training;Functional mobility training;Therapeutic activities;Therapeutic exercise;Neuromuscular re-education;Manual techniques;Patient/family education;Passive range of motion;Vasopneumatic Device;Balance training;Scar mobilization;Taping    PT Next Visit Plan cont with POC for  PROM to right knee and strengthening, modalities PRN for pain relief.  Consulted and Agree with Plan of Care Patient           Patient will benefit from skilled therapeutic intervention in order to improve the following deficits and impairments:  Abnormal gait, Difficulty walking, Decreased activity tolerance, Decreased strength, Increased edema, Decreased range of motion, Pain, Decreased balance  Visit Diagnosis: Stiffness of right knee, not elsewhere classified  Muscle weakness (generalized)  Acute pain of right knee  Localized edema  Chronic pain of right knee     Problem List There are no problems to display for this patient.   Hermelinda Dellen, PTA 11/29/2019, 12:03 PM  [facility] [address] [place], [place], [address] Phone: [phone]   Fax:  [phone]  Name: Jamie Doyle MRN: [id number] Date of Birth: 01/15/1976

## 2019-11-30 DIAGNOSIS — J4540 Moderate persistent asthma, uncomplicated: Secondary | ICD-10-CM | POA: Diagnosis not present

## 2019-11-30 DIAGNOSIS — U071 COVID-19: Secondary | ICD-10-CM | POA: Diagnosis not present

## 2019-12-03 ENCOUNTER — Encounter: Payer: BC Managed Care – PPO | Admitting: Physical Therapy

## 2019-12-03 DIAGNOSIS — J4520 Mild intermittent asthma, uncomplicated: Secondary | ICD-10-CM | POA: Diagnosis not present

## 2019-12-03 DIAGNOSIS — E1169 Type 2 diabetes mellitus with other specified complication: Secondary | ICD-10-CM | POA: Diagnosis not present

## 2019-12-03 DIAGNOSIS — I10 Essential (primary) hypertension: Secondary | ICD-10-CM | POA: Diagnosis not present

## 2019-12-03 DIAGNOSIS — M25561 Pain in right knee: Secondary | ICD-10-CM | POA: Diagnosis not present

## 2019-12-10 ENCOUNTER — Ambulatory Visit: Payer: BC Managed Care – PPO | Admitting: Physical Therapy

## 2019-12-13 ENCOUNTER — Other Ambulatory Visit: Payer: Self-pay

## 2019-12-13 ENCOUNTER — Ambulatory Visit: Payer: BC Managed Care – PPO | Admitting: *Deleted

## 2019-12-13 DIAGNOSIS — M25661 Stiffness of right knee, not elsewhere classified: Secondary | ICD-10-CM

## 2019-12-13 DIAGNOSIS — R600 Localized edema: Secondary | ICD-10-CM | POA: Diagnosis not present

## 2019-12-13 DIAGNOSIS — M6281 Muscle weakness (generalized): Secondary | ICD-10-CM | POA: Diagnosis not present

## 2019-12-13 DIAGNOSIS — M25561 Pain in right knee: Secondary | ICD-10-CM

## 2019-12-13 DIAGNOSIS — G8929 Other chronic pain: Secondary | ICD-10-CM | POA: Diagnosis not present

## 2019-12-13 NOTE — Therapy (Signed)
[facility] [address] [place], [place], [address] Phone: [phone]   Fax:  [phone]  Physical Therapy Treatment  Patient Details  Name: Jamie Doyle MRN: [id number] Date of Birth: [birth year] Referring Provider (PT): Elinor Dodge, MD   Encounter Date: 12/13/2019   PT End of Session - 12/13/19 1221    Visit Number 14    Number of Visits 18    Date for PT Re-Evaluation 12/05/19    Authorization Type BCBS; Medicaid, Progress note every 10th visit    PT Start Time 1030    PT Stop Time 1117    PT Time Calculation (min) 47 min           No past medical history on file.  No past surgical history on file.  There were no vitals filed for this visit.   Subjective Assessment - 12/13/19 1046    Subjective COVID-19 screen performed prior to patient entering clinic.  Patient arrived with some discomfort.nOT WEARING THE BRACE TODAY    Pertinent History RIGHT knee arthroscopy with allograft ACL reconstruction, allograft MCL reconstruction, partial lateral meniscectomy, and debridement/chondroplasty 10/10/2019, Asthma, HTN, Cholecystectomy, tubal ligation.    Limitations Sitting;Standing;Walking;House hold activities    How long can you sit comfortably? short periods    How long can you stand comfortably? short periods    How long can you walk comfortably? short periods    Diagnostic tests MRI confirmed MCL, ACL and meniscus tear    Patient Stated Goals Decrease pain, Get back to usual routine    Currently in Pain? Yes    Pain Score 3     Pain Location Knee    Pain Orientation Right                             [facility] Adult PT Treatment/Exercise - 12/13/19 0001      Knee/Hip Exercises: Aerobic   Stationary Bike L2-3 x 15 mins      Knee/Hip Exercises: Standing   Heel Raises Both;3 sets;10 reps    Heel Raises Limitations toe raises 3x10    Knee Flexion AROM;Right;3 sets;10 reps    Forward Lunges Right;3  sets;10 reps   short lunge 8 inc step quad control   Terminal Knee Extension AROM;Right;2 sets;10 reps    Hip ADduction --   HS curl   SLS RT LE stance with LT foot toe touch    Other Standing Knee Exercises one step holds x 3 each side                    PT Short Term Goals - 11/26/19 1713      PT SHORT TERM GOAL #1   Title Patient will be independent with HEP    Baseline no knowledge of HEP    Time 4    Period Weeks    Status Achieved      PT SHORT TERM GOAL #2   Title Patient will demonstrate 90+ degrees of right knee flexion PROM to improve ROM    Baseline 41 degrees right knee PROM    Time 4    Period Weeks    Status Achieved      PT SHORT TERM GOAL #3   Title Patient will demonstrate 0 degrees of right knee extension PROM to improve ROM    Baseline 8 degrees from neutral right knee extension PROM    Time 4    Period Weeks  Status Achieved             PT Long Term Goals - 11/26/19 1713      PT LONG TERM GOAL #1   Title Patient will be independent with advanced HEP    Baseline No knowledge of appropriate advanced HEP    Time 6    Period Weeks    Status On-going      PT LONG TERM GOAL #2   Title Patient will demonstrate 0-120+ degrees of right knee extension/flexion AROM to improve ability to perform functional tasks.    Baseline 41 degrees right knee flexion PROM, 8 degrees from neutral right knee extension PROM; no active ROM per protocol    Time 6    Period Weeks    Status Achieved      PT LONG TERM GOAL #3   Title Patient will ambulate with a SPC or no AD with minimal gait deviations and right knee pain less than or equal to 3/10.    Baseline ambulating with bilateral axillary crutches and NWB right knee pain average 8/10    Time 6    Period Weeks    Status Achieved      PT LONG TERM GOAL #4   Title Patient will report ability to perform ADLs and home activities with right knee pain less than or equal to 4/10.    Baseline unable to  perform ADLs and home activities without assistance.    Time 6    Period Weeks    Status On-going      PT LONG TERM GOAL #5   Title Patient will demonstrate reciprocating stair ambulations with one rail to safely access 2nd floor of home.    Baseline unable at this time    Time 6    Period Weeks    Status On-going                 Plan - 12/13/19 1227    Clinical Impression Statement Pt arrived today doing fairly well and arrived without her brace. She reports only having one more visit so Rx focused on HEP  for RT LE strengthening and proprioception.    Comorbidities RIGHT knee arthroscopy with allograft ACL reconstruction, allograft MCL reconstruction, partial lateral meniscectomy, and debridement/chondroplasty 10/10/2019,Asthma, HTN, Cholecystectomy, tubal ligation.    Examination-Activity Limitations Locomotion Level;Other;Hygiene/Grooming;Dressing;Stand;Stairs;Squat;Sleep;Bend;Transfers;Bed Mobility    Examination-Participation Restrictions Community Activity;Other    Stability/Clinical Decision Making Stable/Uncomplicated    Rehab Potential Good    PT Frequency 3x / week    PT Duration 6 weeks    PT Treatment/Interventions ADLs/Self Care Home Management;Cryotherapy;Electrical Stimulation;Ultrasound;Moist Heat;Gait training;Stair training;Functional mobility training;Therapeutic activities;Therapeutic exercise;Neuromuscular re-education;Manual techniques;Patient/family education;Passive range of motion;Vasopneumatic Device;Balance training;Scar mobilization;Taping    PT Next Visit Plan finalize HEP    Consulted and Agree with Plan of Care Patient           Patient will benefit from skilled therapeutic intervention in order to improve the following deficits and impairments:  Abnormal gait, Difficulty walking, Decreased activity tolerance, Decreased strength, Increased edema, Decreased range of motion, Pain, Decreased balance  Visit Diagnosis: Stiffness of right knee, not  elsewhere classified  Muscle weakness (generalized)  Acute pain of right knee  Localized edema     Problem List There are no problems to display for this patient.   Azia Toutant,CHRIS, PTA 12/13/2019, 12:33 PM  [facility] [address] [place], [place], [address] Phone: [phone]   Fax:  [phone]  Name: Jamie Doyle MRN:  [id number] Date of Birth: 09/30/1975

## 2019-12-17 ENCOUNTER — Ambulatory Visit: Payer: BC Managed Care – PPO | Admitting: *Deleted

## 2019-12-24 ENCOUNTER — Other Ambulatory Visit: Payer: Self-pay

## 2019-12-24 ENCOUNTER — Ambulatory Visit: Payer: BC Managed Care – PPO | Admitting: Physical Therapy

## 2019-12-24 DIAGNOSIS — M25661 Stiffness of right knee, not elsewhere classified: Secondary | ICD-10-CM | POA: Diagnosis not present

## 2019-12-24 DIAGNOSIS — M6281 Muscle weakness (generalized): Secondary | ICD-10-CM | POA: Diagnosis not present

## 2019-12-24 DIAGNOSIS — U071 COVID-19: Secondary | ICD-10-CM | POA: Diagnosis not present

## 2019-12-24 DIAGNOSIS — M25561 Pain in right knee: Secondary | ICD-10-CM | POA: Diagnosis not present

## 2019-12-24 DIAGNOSIS — G8929 Other chronic pain: Secondary | ICD-10-CM | POA: Diagnosis not present

## 2019-12-24 DIAGNOSIS — J4540 Moderate persistent asthma, uncomplicated: Secondary | ICD-10-CM | POA: Diagnosis not present

## 2019-12-24 DIAGNOSIS — R600 Localized edema: Secondary | ICD-10-CM | POA: Diagnosis not present

## 2019-12-24 NOTE — Therapy (Signed)
[facility] [place], [place], [address] Phone: [phone]   Fax:  [phone]  Physical Therapy Treatment  Patient Details  Name: Jamie Doyle MRN: [id number] Date of Birth: [birth year] Referring Provider (PT): Alphonzo Grieve, MD   Encounter Date: 12/24/2019   PT End of Session - 12/24/19 1437    Visit Number 15    Number of Visits 18    Date for PT Re-Evaluation 12/24/19    PT Start Time 0100    PT Stop Time 0150    PT Time Calculation (min) 50 min    Activity Tolerance Patient tolerated treatment well    Behavior During Therapy [facility] for tasks assessed/performed           No past medical history on file.  No past surgical history on file.  There were no vitals filed for this visit.   Subjective Assessment - 12/24/19 1343    Subjective COVID-19 screen performed prior to patient entering clinic.  Today is my last day.    Patient is accompained by: Interpreter    Pertinent History RIGHT knee arthroscopy with allograft ACL reconstruction, allograft MCL reconstruction, partial lateral meniscectomy, and debridement/chondroplasty 10/10/2019, Asthma, HTN, Cholecystectomy, tubal ligation.    Limitations Sitting;Standing;Walking;House hold activities    How long can you sit comfortably? short periods    How long can you stand comfortably? short periods    How long can you walk comfortably? short periods    Diagnostic tests MRI confirmed MCL, ACL and meniscus tear    Patient Stated Goals Decrease pain, Get back to usual routine    Currently in Pain? Yes    Pain Score 3     Pain Location Knee    Pain Orientation Right    Pain Descriptors / Indicators Aching    Pain Type Surgical pain    Pain Onset More than a month ago                             [facility] Adult PT Treatment/Exercise - 12/24/19 0001      Exercises   Exercises Knee/Hip      Knee/Hip Exercises: Aerobic   Stationary Bike L2-3 x  15 minutes.    Elliptical L2/L2 x 10 minutes (5 minutes forward and 5 minutes back)      Knee/Hip Exercises: Machines for Strengthening   Cybex Knee Extension 10# 90 to 45 degrees.    Total Gym Leg Press 2 plates x 4 minutes.      Knee/Hip Exercises: Standing   Other Standing Knee Exercises Rockerboard x 5 minutes.                    PT Short Term Goals - 11/26/19 1713      PT SHORT TERM GOAL #1   Title Patient will be independent with HEP    Baseline no knowledge of HEP    Time 4    Period Weeks    Status Achieved      PT SHORT TERM GOAL #2   Title Patient will demonstrate 90+ degrees of right knee flexion PROM to improve ROM    Baseline 41 degrees right knee PROM    Time 4    Period Weeks    Status Achieved      PT SHORT TERM GOAL #3   Title Patient will demonstrate 0 degrees of right knee extension PROM to improve ROM  Baseline 8 degrees from neutral right knee extension PROM    Time 4    Period Weeks    Status Achieved             PT Long Term Goals - 12/24/19 1413      PT LONG TERM GOAL #1   Title Patient will be independent with advanced HEP    Time 6    Period Weeks    Status Achieved      PT LONG TERM GOAL #2   Title Patient will demonstrate 0-120+ degrees of right knee extension/flexion AROM to improve ability to perform functional tasks.    Baseline 115 degrees.    Period Weeks    Status Partially Met      PT LONG TERM GOAL #3   Title Patient will ambulate with a SPC or no AD with minimal gait deviations and right knee pain less than or equal to 3/10.    Baseline No asisitve device.    Period Weeks    Status Achieved      PT LONG TERM GOAL #4   Title Patient will report ability to perform ADLs and home activities with right knee pain less than or equal to 4/10.    Time 6    Status Achieved      PT LONG TERM GOAL #5   Title Patient will demonstrate reciprocating stair ambulations with one rail to safely access 2nd floor of home.     Time 6    Status Deferred                  Patient will benefit from skilled therapeutic intervention in order to improve the following deficits and impairments:     Visit Diagnosis: Stiffness of right knee, not elsewhere classified  Muscle weakness (generalized)  Acute pain of right knee     Problem List There are no problems to display for this patient.  PHYSICAL THERAPY DISCHARGE SUMMARY  Visits from Start of Care: 15.  Current functional level related to goals / functional outcomes: See above.   Remaining deficits: Please see goal section.   Education / Equipment: HEP. Plan: Patient agrees to discharge.  Patient goals were partially met. Patient is being discharged due to                                                     ?????     Roney Youtz, [place] MPT 12/24/2019, 2:39 PM  [facility] [place], [place], [address] Phone: [phone]   Fax:  [phone]  Name: Kimiyah Blick MRN: [id number] Date of Birth: [birth year]

## 2019-12-30 DIAGNOSIS — U071 COVID-19: Secondary | ICD-10-CM | POA: Diagnosis not present

## 2019-12-31 DIAGNOSIS — M1712 Unilateral primary osteoarthritis, left knee: Secondary | ICD-10-CM | POA: Diagnosis not present

## 2019-12-31 DIAGNOSIS — M8788 Other osteonecrosis, other site: Secondary | ICD-10-CM | POA: Diagnosis not present

## 2019-12-31 DIAGNOSIS — S83411S Sprain of medial collateral ligament of right knee, sequela: Secondary | ICD-10-CM | POA: Diagnosis not present

## 2019-12-31 DIAGNOSIS — Z5181 Encounter for therapeutic drug level monitoring: Secondary | ICD-10-CM | POA: Diagnosis not present

## 2020-01-01 DIAGNOSIS — Z79891 Long term (current) use of opiate analgesic: Secondary | ICD-10-CM | POA: Diagnosis not present

## 2020-01-01 DIAGNOSIS — Z5181 Encounter for therapeutic drug level monitoring: Secondary | ICD-10-CM | POA: Diagnosis not present

## 2020-01-23 DIAGNOSIS — U071 COVID-19: Secondary | ICD-10-CM | POA: Diagnosis not present

## 2020-01-28 ENCOUNTER — Ambulatory Visit: Payer: BC Managed Care – PPO | Admitting: Physical Therapy

## 2020-01-30 DIAGNOSIS — U071 COVID-19: Secondary | ICD-10-CM | POA: Diagnosis not present

## 2020-01-30 DIAGNOSIS — J4540 Moderate persistent asthma, uncomplicated: Secondary | ICD-10-CM | POA: Diagnosis not present

## 2020-02-04 ENCOUNTER — Ambulatory Visit: Payer: BC Managed Care – PPO | Attending: Sports Medicine | Admitting: Physical Therapy

## 2020-02-04 ENCOUNTER — Other Ambulatory Visit: Payer: Self-pay

## 2020-02-04 ENCOUNTER — Encounter: Payer: Self-pay | Admitting: Physical Therapy

## 2020-02-04 DIAGNOSIS — M25561 Pain in right knee: Secondary | ICD-10-CM | POA: Insufficient documentation

## 2020-02-04 DIAGNOSIS — R600 Localized edema: Secondary | ICD-10-CM | POA: Diagnosis not present

## 2020-02-04 DIAGNOSIS — M6281 Muscle weakness (generalized): Secondary | ICD-10-CM | POA: Diagnosis not present

## 2020-02-04 DIAGNOSIS — G8929 Other chronic pain: Secondary | ICD-10-CM | POA: Diagnosis not present

## 2020-02-04 NOTE — Therapy (Addendum)
[facility] [address] [place], [place], [address] Phone: [phone]   Fax:  [phone]  Physical Therapy Evaluation  Patient Details  Name: Jamie Doyle MRN: [id number] Date of Birth: [birth year] Referring Provider (PT): Elinor Dodge, MD   Encounter Date: 02/04/2020   PT End of Session - 02/04/20 1214    Visit Number 1    Number of Visits 8    Date for PT Re-Evaluation 03/10/20    Authorization Type BCBS; Medicaid, Progress note every 10th visit    PT Start Time 1115    PT Stop Time 1200    PT Time Calculation (min) 45 min    Activity Tolerance Patient tolerated treatment well    Behavior During Therapy [facility] for tasks assessed/performed           History reviewed. No pertinent past medical history.  History reviewed. No pertinent surgical history.  There were no vitals filed for this visit.    Subjective Assessment - 02/04/20 1317    Subjective COVID-19 screening performed upon arrival. Patient arrives to physical therapy with right knee pain, weakness, and difficulty walking secondary to a R knee ACL and MCL repair on 10/10/2019. Patient returns to physical therapy to address ongoing strength deficits and limitations with standing, walking, and stair negotiation. Patient reports pain at worst as 5-7/10 and pain at best as 0/10. Patient utilizes [facility] and her brace for long distance walking for safety. Patient gains assistance for home tasks from husband and children. Patient's goals are to decrease pain, improve movement, improve strength, and improve ability to perform ADLs and home activities.    Pertinent History RIGHT knee arthroscopy with allograft ACL reconstruction, allograft MCL reconstruction, partial lateral meniscectomy, and debridement/chondroplasty 10/10/2019, Asthma, HTN, Cholecystectomy, tubal ligation.    Limitations Sitting;Standing;Walking;House hold activities    How long can you sit comfortably? "stiff  after sitting for long periods"    How long can you stand comfortably? 20 minutes    How long can you walk comfortably? 20-30 minutes    Diagnostic tests --    Patient Stated Goals Decrease pain, improve movement, Get back to usual routine    Currently in Pain? Yes    Pain Score 4     Pain Location Knee    Pain Orientation Right    Pain Descriptors / Indicators Aching;Dull    Pain Type Surgical pain    Pain Onset More than a month ago    Pain Frequency Intermittent    Aggravating Factors  a lot of activity and cold weather    Pain Relieving Factors Rest, ice, meds, naproxen & tramadol    Effect of Pain on Daily Activities some house work is hard, caretaking for kids              [facility] PT Assessment - 02/04/20 0001      Assessment   Medical Diagnosis S/P AC reconstruction, right    Referring Provider (PT) Elinor Dodge, MD    Onset Date/Surgical Date 10/10/19    Next MD Visit 04/08/2020    Prior Therapy yes      Precautions   Precautions None      Restrictions   Weight Bearing Restrictions No      Balance Screen   Has the patient fallen in the past 6 months No    Has the patient had a decrease in activity level because of a fear of falling?  No    Is the patient reluctant to leave  their home because of a fear of falling?  No      Home Environment   Living Environment Private residence    Living Arrangements Spouse/significant other;Children    Type of Home House    Home Access Level entry    Home Layout Able to live on main level with bedroom/bathroom    Alternate Level Stairs-Number of Steps 7, foyer, 7   split level   Alternate Level Stairs-Rails Left      Prior Function   Level of Independence Independent      ROM / Strength   AROM / PROM / Strength PROM;AROM;Strength      AROM   Overall AROM  Within functional limits for tasks performed    AROM Assessment Site Knee    Right/Left Knee Right    Right Knee Extension 0    Right Knee Flexion 124       PROM   Overall PROM  Within functional limits for tasks performed    PROM Assessment Site Knee    Right/Left Knee Right    Right Knee Extension -2   into hyperextension   Right Knee Flexion 130      Strength   Overall Strength Within functional limits for tasks performed    Strength Assessment Site Knee;Hip    Right/Left Hip Right    Right Hip Extension 4/5    Right Hip ABduction 4/5    Right/Left Knee Right    Right Knee Flexion 4/5    Right Knee Extension 3+/5   (+) pain     Palpation   Patella mobility decreased inferior/superior mobilization; medial/lateral WFL    Palpation comment very tender to lateral                      Objective measurements completed on examination: See above findings.       [facility] Adult PT Treatment/Exercise - 02/04/20 0001      Exercises   Exercises Knee/Hip      Knee/Hip Exercises: Aerobic   Recumbent Bike level 5 x11 minutes                  PT Education - 02/04/20 1325    Education Details heel/toe raises, staggered sit to stand, tandem stance, step outs    Person(s) Educated Patient    Methods Explanation;Demonstration;Handout    Comprehension Verbalized understanding            PT Short Term Goals - 02/04/20 1305      PT SHORT TERM GOAL #1   Title Patient will be independent with HEP    Baseline no knowledge of HEP    Time 2    Period Weeks    Status New             PT Long Term Goals - 02/04/20 1306      PT LONG TERM GOAL #1   Title Patient will be independent with advanced HEP    Baseline No knowledge of appropriate advanced HEP    Time 4    Period Weeks    Status New      PT LONG TERM GOAL #2   Title Patient will demonstrate 4+/5 or greater right LE MMT to improve stability during functional tasks.    Baseline 3+/5 to 4/5 right LE MMT    Time 4    Period Weeks    Status New      PT LONG TERM GOAL #3  Title Patient will report ability to negotiate steps with one railing or no  railing with reciprocal gait pattern and right knee pain less than or equal to 3/10 to safely access areas of her home.    Baseline pain up to 5-7/10 in right knee    Time 4    Period Weeks    Status New      PT LONG TERM GOAL #4   Title Patient will demonsrate proper squat mechanics with right knee pain less than or equal to 3/10 to safely perform home activities.    Baseline up to 7/10 pain with squatting.    Time 4    Period Weeks    Status New      PT LONG TERM GOAL #5   Title Paitent will report ability to ambulate community distances with right knee pain less than or equal to 3/10 and minimal reports of LE fatigue.    Baseline 20-30 min walking tolerance    Time 4    Period Weeks    Status New                  Plan - 02/04/20 1245    Clinical Impression Statement Patient is a 45 year old female who presents to physical therapy with right knee pain, decreased right knee MMT, and difficulty walking secondary right knee arthroscopy on 10/10/2019. Patient's ROM WFL. Patient noted with decreased right quad strength but with reports of pain with strength test. Patient's gait is [facility] but reports more fatigue and pain with prolonged walking. Patient and PT discussed plan of care, frequency and duration of PT and discussed HEP to which patient reported understanding. Patient reported understanding. Patient would benefit from skilled physical therapy to address deficits and address patient's goals.    Comorbidities RIGHT knee arthroscopy with allograft ACL reconstruction, allograft MCL reconstruction, partial lateral meniscectomy, and debridement/chondroplasty 10/10/2019,Asthma, HTN, Cholecystectomy, tubal ligation.    Examination-Activity Limitations Locomotion Level;Other;Hygiene/Grooming;Dressing;Stand;Stairs;Squat;Sleep;Bend;Transfers;Bed Mobility    Examination-Participation Restrictions Community Activity;Other    Stability/Clinical Decision Making Stable/Uncomplicated    Rehab  Potential Good    PT Frequency 3x / week    PT Duration 6 weeks    PT Treatment/Interventions ADLs/Self Care Home Management;Ultrasound;Moist Heat;Gait training;Stair training;Functional mobility training;Therapeutic activities;Therapeutic exercise;Neuromuscular re-education;Manual techniques;Patient/family education;Passive range of motion;Balance training;Scar mobilization;Taping;Iontophoresis 4mg /ml Dexamethasone    PT Next Visit Plan bike or nustep, right LE strengthening, balance, proprioception to tolerance. No modalities per secondary insurance    PT Home Exercise Plan see patient education section    Consulted and Agree with Plan of Care Patient           Patient will benefit from skilled therapeutic intervention in order to improve the following deficits and impairments:  Abnormal gait,Difficulty walking,Decreased activity tolerance,Decreased strength,Increased edema,Decreased range of motion,Pain,Decreased balance  Visit Diagnosis: Muscle weakness (generalized) - Plan: PT plan of care cert/re-cert  Localized edema - Plan: PT plan of care cert/re-cert  Chronic pain of right knee - Plan: PT plan of care cert/re-cert     Problem List There are no problems to display for this patient.   , PT, DPT 02/04/2020, 2:17 PM  [facility] [address] [place], [place], [place]  Phone: [phone]   Fax:  [phone]  Name: Sanaiya Welliver MRN: Orpah Melter Date of Birth: 06-22-1975

## 2020-02-07 ENCOUNTER — Ambulatory Visit: Payer: BC Managed Care – PPO | Admitting: Physical Therapy

## 2020-02-23 DIAGNOSIS — U071 COVID-19: Secondary | ICD-10-CM | POA: Diagnosis not present

## 2020-02-23 DIAGNOSIS — J4540 Moderate persistent asthma, uncomplicated: Secondary | ICD-10-CM | POA: Diagnosis not present

## 2020-02-26 ENCOUNTER — Encounter: Payer: Self-pay | Admitting: Physical Therapy

## 2020-02-26 ENCOUNTER — Other Ambulatory Visit: Payer: Self-pay

## 2020-02-26 ENCOUNTER — Ambulatory Visit: Payer: BC Managed Care – PPO | Attending: Sports Medicine | Admitting: Physical Therapy

## 2020-02-26 DIAGNOSIS — M25561 Pain in right knee: Secondary | ICD-10-CM | POA: Diagnosis not present

## 2020-02-26 DIAGNOSIS — G8929 Other chronic pain: Secondary | ICD-10-CM | POA: Diagnosis not present

## 2020-02-26 DIAGNOSIS — R600 Localized edema: Secondary | ICD-10-CM | POA: Diagnosis not present

## 2020-02-26 DIAGNOSIS — M6281 Muscle weakness (generalized): Secondary | ICD-10-CM | POA: Insufficient documentation

## 2020-02-26 DIAGNOSIS — M25661 Stiffness of right knee, not elsewhere classified: Secondary | ICD-10-CM | POA: Diagnosis not present

## 2020-02-26 NOTE — Therapy (Signed)
[facility] [address] [place], [place], [address] Phone: [phone]   Fax:  [phone]  Physical Therapy Treatment  Patient Details  Name: Jamie Doyle MRN: [id number] Date of Birth: 1975/10/31 Referring Provider (PT): Elinor Dodge, MD   Encounter Date: 02/26/2020   PT End of Session - 02/26/20 0911    Visit Number 2    Number of Visits 8    Date for PT Re-Evaluation 03/10/20    Authorization Type BCBS; Medicaid, Progress note every 10th visit    PT Start Time 0902    PT Stop Time 0950    PT Time Calculation (min) 48 min    Activity Tolerance Patient tolerated treatment well    Behavior During Therapy [facility] for tasks assessed/performed           History reviewed. No pertinent past medical history.  History reviewed. No pertinent surgical history.  There were no vitals filed for this visit.   Subjective Assessment - 02/26/20 0905    Subjective COVID 19 screening performed on patient upon arrival. Patient reports her knee feels okay. Sometimes has some discomfort but cold weather is worse. Patient reports that she still has a hard time kneeling and is getting upstairs better.    Pertinent History RIGHT knee arthroscopy with allograft ACL reconstruction, allograft MCL reconstruction, partial lateral meniscectomy, and debridement/chondroplasty 10/10/2019, Asthma, HTN, Cholecystectomy, tubal ligation.    Limitations Sitting;Standing;Walking;House hold activities    How long can you sit comfortably? "stiff after sitting for long periods"    How long can you stand comfortably? 20 minutes    How long can you walk comfortably? 20-30 minutes    Patient Stated Goals Decrease pain, improve movement, Get back to usual routine    Currently in Pain? Yes    Pain Score 3     Pain Location Knee    Pain Orientation Right    Pain Descriptors / Indicators Discomfort    Pain Type Surgical pain    Pain Onset More than a month ago     Pain Frequency Intermittent              [facility] PT Assessment - 02/26/20 0001      Assessment   Medical Diagnosis S/P AC reconstruction, right    Referring Provider (PT) Elinor Dodge, MD    Onset Date/Surgical Date 10/10/19    Next MD Visit 04/08/2020    Prior Therapy yes      Precautions   Precautions None      Restrictions   Weight Bearing Restrictions No                         [facility] Adult PT Treatment/Exercise - 02/26/20 0001      Knee/Hip Exercises: Aerobic   Tread Mill Backwards walking 4 ramp 1.0 mph x5 min    Recumbent Bike L3, seat 4 x12 min      Knee/Hip Exercises: Machines for Strengthening   Cybex Knee Extension 10# 3x10 reps    Cybex Knee Flexion 30# 3x10 reps      Knee/Hip Exercises: Standing   Terminal Knee Extension Strengthening;Right;20 reps;Theraband    Theraband Level (Terminal Knee Extension) Level 4 (Blue)    Lateral Step Up Right;2 sets;10 reps;Hand Hold: 2;Step Height: 4"    Forward Step Up Right;2 sets;10 reps;Hand Hold: 2;Step Height: 6"    Step Down Right;2 sets;10 reps;Hand Hold: 2;Step Height: 4"      Knee/Hip Exercises: Seated  Sit to Sand 15 reps;without UE support   RLE split squat                   PT Short Term Goals - 02/26/20 1004      PT SHORT TERM GOAL #1   Title Patient will be independent with HEP    Baseline no knowledge of HEP    Time 2    Period Weeks    Status On-going             PT Long Term Goals - 02/26/20 1003      PT LONG TERM GOAL #1   Title Patient will be independent with advanced HEP    Baseline No knowledge of appropriate advanced HEP    Time 4    Period Weeks    Status On-going      PT LONG TERM GOAL #2   Title Patient will demonstrate 4+/5 or greater right LE MMT to improve stability during functional tasks.    Baseline 3+/5 to 4/5 right LE MMT    Time 4    Period Weeks    Status On-going      PT LONG TERM GOAL #3   Title Patient will report ability to  negotiate steps with one railing or no railing with reciprocal gait pattern and right knee pain less than or equal to 3/10 to safely access areas of her home.    Baseline pain up to 5-7/10 in right knee    Time 4    Period Weeks    Status On-going      PT LONG TERM GOAL #4   Title Patient will demonsrate proper squat mechanics with right knee pain less than or equal to 3/10 to safely perform home activities.    Baseline up to 7/10 pain with squatting.    Time 4    Period Weeks    Status On-going      PT LONG TERM GOAL #5   Title Paitent will report ability to ambulate community distances with right knee pain less than or equal to 3/10 and minimal reports of LE fatigue.    Baseline 20-30 min walking tolerance    Time 4    Period Weeks    Status On-going                 Plan - 02/26/20 0957    Clinical Impression Statement Patient presented in clinic with reports of weakness of RLE following knee surgery on 10/09/2020. Patient guided through quad strengthening dependent therex with no complaint of pain only muscle fatigue. Weakness of B hips noted throughout treatment as well. Patient did report greater discomfort and difficulty with machine HS strengthening during eccentric return.    Comorbidities RIGHT knee arthroscopy with allograft ACL reconstruction, allograft MCL reconstruction, partial lateral meniscectomy, and debridement/chondroplasty 10/10/2019,Asthma, HTN, Cholecystectomy, tubal ligation.    Examination-Activity Limitations Locomotion Level;Other;Hygiene/Grooming;Dressing;Stand;Stairs;Squat;Sleep;Bend;Transfers;Bed Mobility    Examination-Participation Restrictions Community Activity;Other    Stability/Clinical Decision Making Stable/Uncomplicated    Rehab Potential Good    PT Frequency 3x / week    PT Duration 6 weeks    PT Treatment/Interventions ADLs/Self Care Home Management;Ultrasound;Moist Heat;Gait training;Stair training;Functional mobility training;Therapeutic  activities;Therapeutic exercise;Neuromuscular re-education;Manual techniques;Patient/family education;Passive range of motion;Balance training;Scar mobilization;Taping;Iontophoresis 4mg /ml Dexamethasone    PT Next Visit Plan bike or nustep, right LE strengthening, balance, proprioception to tolerance. No modalities per secondary insurance    PT Home Exercise Plan see patient education section    Consulted and  Agree with Plan of Care Patient           Patient will benefit from skilled therapeutic intervention in order to improve the following deficits and impairments:  Abnormal gait,Difficulty walking,Decreased activity tolerance,Decreased strength,Increased edema,Decreased range of motion,Pain,Decreased balance  Visit Diagnosis: Muscle weakness (generalized)  Localized edema  Chronic pain of right knee  Stiffness of right knee, not elsewhere classified     Problem List There are no problems to display for this patient.   Marvell Fuller, PTA 02/26/2020, 10:04 AM  [facility] [address] [place], [place], [address] Phone: [phone]   Fax:  [phone]  Name: Welda Azzarello MRN: [id number] Date of Birth: 08/28/1975

## 2020-03-01 DIAGNOSIS — J4540 Moderate persistent asthma, uncomplicated: Secondary | ICD-10-CM | POA: Diagnosis not present

## 2020-03-01 DIAGNOSIS — U071 COVID-19: Secondary | ICD-10-CM | POA: Diagnosis not present

## 2020-03-02 ENCOUNTER — Encounter: Payer: BC Managed Care – PPO | Admitting: Physical Therapy

## 2020-03-04 DIAGNOSIS — J4520 Mild intermittent asthma, uncomplicated: Secondary | ICD-10-CM | POA: Diagnosis not present

## 2020-03-04 DIAGNOSIS — I10 Essential (primary) hypertension: Secondary | ICD-10-CM | POA: Diagnosis not present

## 2020-03-04 DIAGNOSIS — F411 Generalized anxiety disorder: Secondary | ICD-10-CM | POA: Diagnosis not present

## 2020-03-04 DIAGNOSIS — K219 Gastro-esophageal reflux disease without esophagitis: Secondary | ICD-10-CM | POA: Diagnosis not present

## 2020-03-04 DIAGNOSIS — E1169 Type 2 diabetes mellitus with other specified complication: Secondary | ICD-10-CM | POA: Diagnosis not present

## 2020-03-04 DIAGNOSIS — M25561 Pain in right knee: Secondary | ICD-10-CM | POA: Diagnosis not present

## 2020-03-05 ENCOUNTER — Ambulatory Visit: Payer: BC Managed Care – PPO | Admitting: Physical Therapy

## 2020-03-05 ENCOUNTER — Encounter: Payer: Self-pay | Admitting: Physical Therapy

## 2020-03-05 ENCOUNTER — Other Ambulatory Visit: Payer: Self-pay

## 2020-03-05 DIAGNOSIS — M25661 Stiffness of right knee, not elsewhere classified: Secondary | ICD-10-CM | POA: Diagnosis not present

## 2020-03-05 DIAGNOSIS — G8929 Other chronic pain: Secondary | ICD-10-CM | POA: Diagnosis not present

## 2020-03-05 DIAGNOSIS — M25561 Pain in right knee: Secondary | ICD-10-CM | POA: Diagnosis not present

## 2020-03-05 DIAGNOSIS — M6281 Muscle weakness (generalized): Secondary | ICD-10-CM | POA: Diagnosis not present

## 2020-03-05 DIAGNOSIS — R600 Localized edema: Secondary | ICD-10-CM

## 2020-03-05 NOTE — Therapy (Addendum)
[facility] [address] [place], [place], [address] Phone: [phone]   Fax:  [phone]  Physical Therapy Treatment  Patient Details  Name: Jamie Doyle MRN: [id number] Date of Birth: 02-02-1975 Referring Provider (PT): Elinor Dodge, MD   Encounter Date: 03/05/2020   PT End of Session - 03/05/20 1351    Visit Number 3    Number of Visits 8    Date for PT Re-Evaluation 04/17/20    Authorization Type BCBS; Medicaid, Progress note every 10th visit    PT Start Time 1344    PT Stop Time 1428    PT Time Calculation (min) 44 min    Activity Tolerance Patient tolerated treatment well    Behavior During Therapy [facility] for tasks assessed/performed           History reviewed. No pertinent past medical history.  History reviewed. No pertinent surgical history.  There were no vitals filed for this visit.   Subjective Assessment - 03/05/20 1349    Subjective COVID 19 screening performed on patient upon arrival. Patient reports pain about 5/10. Patient sat at a ballgame last night and caused some discomfort.    Pertinent History RIGHT knee arthroscopy with allograft ACL reconstruction, allograft MCL reconstruction, partial lateral meniscectomy, and debridement/chondroplasty 10/10/2019, Asthma, HTN, Cholecystectomy, tubal ligation.    Limitations Sitting;Standing;Walking;House hold activities    How long can you sit comfortably? "stiff after sitting for long periods"    How long can you stand comfortably? 20 minutes    How long can you walk comfortably? 20-30 minutes    Patient Stated Goals Decrease pain, improve movement, Get back to usual routine    Currently in Pain? Yes    Pain Score 5     Pain Location Knee    Pain Orientation Right    Pain Descriptors / Indicators Discomfort    Pain Type Surgical pain    Pain Onset More than a month ago    Pain Frequency Intermittent              [facility] PT Assessment - 03/05/20 0001       Assessment   Medical Diagnosis S/P AC reconstruction, right    Referring Provider (PT) Elinor Dodge, MD    Onset Date/Surgical Date 10/10/19    Next MD Visit 04/08/2020    Prior Therapy yes      Precautions   Precautions None      Restrictions   Weight Bearing Restrictions No                         [facility] Adult PT Treatment/Exercise - 03/05/20 0001      Knee/Hip Exercises: Aerobic   Recumbent Bike L3, seat 5 x15 min      Knee/Hip Exercises: Machines for Strengthening   Cybex Knee Extension 10# 3x10 reps    Cybex Knee Flexion 30# 3x10 reps    Cybex Leg Press 1.5 pl, seat 7 x20 reps      Knee/Hip Exercises: Standing   Lateral Step Up Right;2 sets;10 reps;Hand Hold: 2;Step Height: 4"    Forward Step Up Right;2 sets;10 reps;Hand Hold: 2;Step Height: 8"    Step Down Right;2 sets;10 reps;Hand Hold: 2;Step Height: 4"      Knee/Hip Exercises: Seated   Sit to Sand 15 reps;without UE support   split squat                   PT Short Term  Goals - 02/26/20 1004      PT SHORT TERM GOAL #1   Title Patient will be independent with HEP    Baseline no knowledge of HEP    Time 2    Period Weeks    Status On-going             PT Long Term Goals - 02/26/20 1003      PT LONG TERM GOAL #1   Title Patient will be independent with advanced HEP    Baseline No knowledge of appropriate advanced HEP    Time 4    Period Weeks    Status On-going      PT LONG TERM GOAL #2   Title Patient will demonstrate 4+/5 or greater right LE MMT to improve stability during functional tasks.    Baseline 3+/5 to 4/5 right LE MMT    Time 4    Period Weeks    Status On-going      PT LONG TERM GOAL #3   Title Patient will report ability to negotiate steps with one railing or no railing with reciprocal gait pattern and right knee pain less than or equal to 3/10 to safely access areas of her home.    Baseline pain up to 5-7/10 in right knee    Time 4    Period  Weeks    Status On-going      PT LONG TERM GOAL #4   Title Patient will demonsrate proper squat mechanics with right knee pain less than or equal to 3/10 to safely perform home activities.    Baseline up to 7/10 pain with squatting.    Time 4    Period Weeks    Status On-going      PT LONG TERM GOAL #5   Title Paitent will report ability to ambulate community distances with right knee pain less than or equal to 3/10 and minimal reports of LE fatigue.    Baseline 20-30 min walking tolerance    Time 4    Period Weeks    Status On-going                 Plan - 03/05/20 1440    Clinical Impression Statement Patient presented in clinic with more LE strengthening. Patient reported more fatigue and some swelling after short sitting for prolonged period of time. Patient progressed through functional and machine strengthening with only reports of fatigue. Muscle weakness notable as session progressed.    Comorbidities RIGHT knee arthroscopy with allograft ACL reconstruction, allograft MCL reconstruction, partial lateral meniscectomy, and debridement/chondroplasty 10/10/2019,Asthma, HTN, Cholecystectomy, tubal ligation.    Examination-Activity Limitations Locomotion Level;Other;Hygiene/Grooming;Dressing;Stand;Stairs;Squat;Sleep;Bend;Transfers;Bed Mobility    Examination-Participation Restrictions Community Activity;Other    Stability/Clinical Decision Making Stable/Uncomplicated    Rehab Potential Good    PT Frequency 3x / week    PT Duration 6 weeks    PT Treatment/Interventions ADLs/Self Care Home Management;Ultrasound;Moist Heat;Gait training;Stair training;Functional mobility training;Therapeutic activities;Therapeutic exercise;Neuromuscular re-education;Manual techniques;Patient/family education;Passive range of motion;Balance training;Scar mobilization;Taping;Iontophoresis 4mg /ml Dexamethasone    PT Next Visit Plan bike or nustep, right LE strengthening, balance, proprioception to  tolerance. No modalities per secondary insurance    PT Home Exercise Plan see patient education section    Consulted and Agree with Plan of Care Patient           Patient will benefit from skilled therapeutic intervention in order to improve the following deficits and impairments:  Abnormal gait,Difficulty walking,Decreased activity tolerance,Decreased strength,Increased edema,Decreased range of motion,Pain,Decreased balance  Visit Diagnosis: Muscle weakness (generalized) - Plan: PT plan of care cert/re-cert  Localized edema - Plan: PT plan of care cert/re-cert  Chronic pain of right knee - Plan: PT plan of care cert/re-cert  Stiffness of right knee, not elsewhere classified - Plan: PT plan of care cert/re-cert     Problem List There are no problems to display for this patient.   Marvell Fuller, PTA 03/05/2020, 3:07 PM  [facility] [address] [place], [place], [address] Phone: [phone]   Fax:  [phone]  Name: Jamie Doyle MRN: [id number] Date of Birth: Mar 23, 1975

## 2020-03-10 ENCOUNTER — Ambulatory Visit: Payer: BC Managed Care – PPO | Admitting: Physical Therapy

## 2020-03-10 ENCOUNTER — Encounter: Payer: Self-pay | Admitting: Physical Therapy

## 2020-03-10 ENCOUNTER — Other Ambulatory Visit: Payer: Self-pay

## 2020-03-10 DIAGNOSIS — M25661 Stiffness of right knee, not elsewhere classified: Secondary | ICD-10-CM

## 2020-03-10 DIAGNOSIS — M25561 Pain in right knee: Secondary | ICD-10-CM | POA: Diagnosis not present

## 2020-03-10 DIAGNOSIS — M6281 Muscle weakness (generalized): Secondary | ICD-10-CM | POA: Diagnosis not present

## 2020-03-10 DIAGNOSIS — G8929 Other chronic pain: Secondary | ICD-10-CM | POA: Diagnosis not present

## 2020-03-10 DIAGNOSIS — R600 Localized edema: Secondary | ICD-10-CM

## 2020-03-10 NOTE — Therapy (Signed)
[facility] [address] [place], [place], [address] Phone: [phone]   Fax:  [phone]  Physical Therapy Treatment  Patient Details  Name: Jamie Doyle MRN: [id number] Date of Birth: Dec 03, 1975 Referring Provider (PT): Elinor Dodge, MD   Encounter Date: 03/10/2020   PT End of Session - 03/10/20 1400    Visit Number 4    Number of Visits 8    Date for PT Re-Evaluation 04/17/20    Authorization Type BCBS; Medicaid, Progress note every 10th visit    PT Start Time 1348    PT Stop Time 1430    PT Time Calculation (min) 42 min    Activity Tolerance Patient tolerated treatment well    Behavior During Therapy [facility] for tasks assessed/performed           History reviewed. No pertinent past medical history.  History reviewed. No pertinent surgical history.  There were no vitals filed for this visit.   Subjective Assessment - 03/10/20 1359    Subjective COVID 19 screening performed on patient upon arrival. Reports she has been able to tell improvement regarding knee strength when going up stairs.    Pertinent History RIGHT knee arthroscopy with allograft ACL reconstruction, allograft MCL reconstruction, partial lateral meniscectomy, and debridement/chondroplasty 10/10/2019, Asthma, HTN, Cholecystectomy, tubal ligation.    Limitations Sitting;Standing;Walking;House hold activities    How long can you sit comfortably? "stiff after sitting for long periods"    How long can you stand comfortably? 20 minutes    How long can you walk comfortably? 20-30 minutes    Patient Stated Goals Decrease pain, improve movement, Get back to usual routine    Currently in Pain? Yes    Pain Location Knee    Pain Orientation Right    Pain Descriptors / Indicators Sore;Tender    Pain Type Surgical pain    Pain Onset More than a month ago    Pain Frequency Intermittent              [facility] PT Assessment - 03/10/20 0001      Assessment    Medical Diagnosis S/P AC reconstruction, right    Referring Provider (PT) Elinor Dodge, MD    Onset Date/Surgical Date 10/10/19    Next MD Visit 04/08/2020    Prior Therapy yes      Precautions   Precautions None      Restrictions   Weight Bearing Restrictions No                         [facility] Adult PT Treatment/Exercise - 03/10/20 0001      Knee/Hip Exercises: Aerobic   Recumbent Bike L4, seat 3 x15 min      Knee/Hip Exercises: Machines for Strengthening   Cybex Knee Extension 10# 3x10 reps    Cybex Knee Flexion 30# 3x10 reps    Cybex Leg Press 1.5 pl, seat 7 x20 reps      Knee/Hip Exercises: Standing   Step Down Right;2 sets;10 reps;Hand Hold: 2;Step Height: 4"    Other Standing Knee Exercises R heel dot 4" step x20 reps      Knee/Hip Exercises: Seated   Sit to Sand 15 reps;without UE support   RLE split squat     Knee/Hip Exercises: Supine   Single Leg Bridge Strengthening;Right;10 reps                    PT Short Term Goals - 02/26/20 1004  PT SHORT TERM GOAL #1   Title Patient will be independent with HEP    Baseline no knowledge of HEP    Time 2    Period Weeks    Status On-going             PT Long Term Goals - 02/26/20 1003      PT LONG TERM GOAL #1   Title Patient will be independent with advanced HEP    Baseline No knowledge of appropriate advanced HEP    Time 4    Period Weeks    Status On-going      PT LONG TERM GOAL #2   Title Patient will demonstrate 4+/5 or greater right LE MMT to improve stability during functional tasks.    Baseline 3+/5 to 4/5 right LE MMT    Time 4    Period Weeks    Status On-going      PT LONG TERM GOAL #3   Title Patient will report ability to negotiate steps with one railing or no railing with reciprocal gait pattern and right knee pain less than or equal to 3/10 to safely access areas of her home.    Baseline pain up to 5-7/10 in right knee    Time 4    Period Weeks     Status On-going      PT LONG TERM GOAL #4   Title Patient will demonsrate proper squat mechanics with right knee pain less than or equal to 3/10 to safely perform home activities.    Baseline up to 7/10 pain with squatting.    Time 4    Period Weeks    Status On-going      PT LONG TERM GOAL #5   Title Paitent will report ability to ambulate community distances with right knee pain less than or equal to 3/10 and minimal reports of LE fatigue.    Baseline 20-30 min walking tolerance    Time 4    Period Weeks    Status On-going                 Plan - 03/10/20 1509    Clinical Impression Statement Patient presented in clinic with reports of strength improving. Patient progressed through resistance exercises as well as more functional strengthening. Patient fatigues with all therex. No complaints of pain throughout treatment. Patient requires intermittant multimodal cueing to correct technique of exercise.    Comorbidities RIGHT knee arthroscopy with allograft ACL reconstruction, allograft MCL reconstruction, partial lateral meniscectomy, and debridement/chondroplasty 10/10/2019,Asthma, HTN, Cholecystectomy, tubal ligation.    Examination-Activity Limitations Locomotion Level;Other;Hygiene/Grooming;Dressing;Stand;Stairs;Squat;Sleep;Bend;Transfers;Bed Mobility    Examination-Participation Restrictions Community Activity;Other    Stability/Clinical Decision Making Stable/Uncomplicated    Rehab Potential Good    PT Frequency 3x / week    PT Duration 6 weeks    PT Treatment/Interventions ADLs/Self Care Home Management;Ultrasound;Moist Heat;Gait training;Stair training;Functional mobility training;Therapeutic activities;Therapeutic exercise;Neuromuscular re-education;Manual techniques;Patient/family education;Passive range of motion;Balance training;Scar mobilization;Taping;Iontophoresis 4mg /ml Dexamethasone    PT Next Visit Plan bike or nustep, right LE strengthening, balance, proprioception  to tolerance. No modalities per secondary insurance    PT Home Exercise Plan see patient education section    Consulted and Agree with Plan of Care Patient           Patient will benefit from skilled therapeutic intervention in order to improve the following deficits and impairments:  Abnormal gait,Difficulty walking,Decreased activity tolerance,Decreased strength,Increased edema,Decreased range of motion,Pain,Decreased balance  Visit Diagnosis: Muscle weakness (generalized)  Localized edema  Chronic pain of right knee  Stiffness of right knee, not elsewhere classified     Problem List There are no problems to display for this patient.   Marvell Fuller, PTA 03/10/2020, 3:15 PM  [facility] [address] [place], [place], [address] Phone: [phone]   Fax:  [phone]  Name: Jamie Doyle MRN: [id number] Date of Birth: May 16, 1975

## 2020-03-17 ENCOUNTER — Other Ambulatory Visit: Payer: Self-pay

## 2020-03-17 ENCOUNTER — Ambulatory Visit: Payer: BC Managed Care – PPO | Admitting: Physical Therapy

## 2020-03-17 ENCOUNTER — Encounter: Payer: Self-pay | Admitting: Physical Therapy

## 2020-03-17 DIAGNOSIS — M6281 Muscle weakness (generalized): Secondary | ICD-10-CM | POA: Diagnosis not present

## 2020-03-17 DIAGNOSIS — M25561 Pain in right knee: Secondary | ICD-10-CM

## 2020-03-17 DIAGNOSIS — M25661 Stiffness of right knee, not elsewhere classified: Secondary | ICD-10-CM | POA: Diagnosis not present

## 2020-03-17 DIAGNOSIS — G8929 Other chronic pain: Secondary | ICD-10-CM

## 2020-03-17 DIAGNOSIS — R600 Localized edema: Secondary | ICD-10-CM

## 2020-03-17 NOTE — Therapy (Signed)
[facility] [address] [place], [place], [address] Phone: [phone]   Fax:  [phone]  Physical Therapy Treatment  Patient Details  Name: Jamie Doyle MRN: [id number] Date of Birth: [birth year] Referring Provider (PT): Elinor Dodge, MD   Encounter Date: 03/17/2020   PT End of Session - 03/17/20 1123    Visit Number 5    Number of Visits 8    Date for PT Re-Evaluation 04/17/20    Authorization Type BCBS; Medicaid, Progress note every 10th visit    PT Start Time 1117    PT Stop Time 1200    PT Time Calculation (min) 43 min    Activity Tolerance Patient tolerated treatment well    Behavior During Therapy [facility] for tasks assessed/performed           History reviewed. No pertinent past medical history.  History reviewed. No pertinent surgical history.  There were no vitals filed for this visit.   Subjective Assessment - 03/17/20 1122    Subjective COVID 19 screening performed on patient upon arrival. Continues to see progress.    Pertinent History RIGHT knee arthroscopy with allograft ACL reconstruction, allograft MCL reconstruction, partial lateral meniscectomy, and debridement/chondroplasty 10/10/2019, Asthma, HTN, Cholecystectomy, tubal ligation.    Limitations Sitting;Standing;Walking;House hold activities    How long can you sit comfortably? "stiff after sitting for long periods"    How long can you stand comfortably? 20 minutes    How long can you walk comfortably? 20-30 minutes    Patient Stated Goals Decrease pain, improve movement, Get back to usual routine    Currently in Pain? Other (Comment)   no pain assessment provided             [facility] PT Assessment - 03/17/20 0001      Assessment   Medical Diagnosis S/P AC reconstruction, right    Referring Provider (PT) Elinor Dodge, MD    Onset Date/Surgical Date 10/10/19    Next MD Visit 04/08/2020    Prior Therapy yes      Precautions    Precautions None      Restrictions   Weight Bearing Restrictions No                         [facility] Adult PT Treatment/Exercise - 03/17/20 0001      Knee/Hip Exercises: Aerobic   Recumbent Bike L4, seat 3 x15 min      Knee/Hip Exercises: Machines for Strengthening   Cybex Knee Extension 20# 3x10 reps    Cybex Knee Flexion 40# 3x10 reps    Cybex Leg Press 2.5 pl, seat 7 x30 reps      Knee/Hip Exercises: Standing   Lateral Step Up Right;3 sets;10 reps;Step Height: 6"    Step Down Right;2 sets;10 reps;Hand Hold: 2;Step Height: 4"    Step Down Limitations R heel dot 4" step x20 reps    Walking with Sports Cord 3D walk orange XTS x5 reps each      Knee/Hip Exercises: Seated   Sit to Sand without UE support;20 reps                    PT Short Term Goals - 02/26/20 1004      PT SHORT TERM GOAL #1   Title Patient will be independent with HEP    Baseline no knowledge of HEP    Time 2    Period Weeks    Status On-going  PT Long Term Goals - 02/26/20 1003      PT LONG TERM GOAL #1   Title Patient will be independent with advanced HEP    Baseline No knowledge of appropriate advanced HEP    Time 4    Period Weeks    Status On-going      PT LONG TERM GOAL #2   Title Patient will demonstrate 4+/5 or greater right LE MMT to improve stability during functional tasks.    Baseline 3+/5 to 4/5 right LE MMT    Time 4    Period Weeks    Status On-going      PT LONG TERM GOAL #3   Title Patient will report ability to negotiate steps with one railing or no railing with reciprocal gait pattern and right knee pain less than or equal to 3/10 to safely access areas of her home.    Baseline pain up to 5-7/10 in right knee    Time 4    Period Weeks    Status On-going      PT LONG TERM GOAL #4   Title Patient will demonsrate proper squat mechanics with right knee pain less than or equal to 3/10 to safely perform home activities.    Baseline up to  7/10 pain with squatting.    Time 4    Period Weeks    Status On-going      PT LONG TERM GOAL #5   Title Paitent will report ability to ambulate community distances with right knee pain less than or equal to 3/10 and minimal reports of LE fatigue.    Baseline 20-30 min walking tolerance    Time 4    Period Weeks    Status On-going                 Plan - 03/17/20 1203    Clinical Impression Statement Patient presented in clinic with reports of improvement with strength noted in daily life. Patient progressed through more resisted strengthening with only reports of muscle fatigue. Patient still very cautious with stairs at home with nonreciprical pattern.    Comorbidities RIGHT knee arthroscopy with allograft ACL reconstruction, allograft MCL reconstruction, partial lateral meniscectomy, and debridement/chondroplasty 10/10/2019,Asthma, HTN, Cholecystectomy, tubal ligation.    Examination-Activity Limitations Locomotion Level;Other;Hygiene/Grooming;Dressing;Stand;Stairs;Squat;Sleep;Bend;Transfers;Bed Mobility    Examination-Participation Restrictions Community Activity;Other    Stability/Clinical Decision Making Stable/Uncomplicated    Rehab Potential Good    PT Frequency 3x / week    PT Duration 6 weeks    PT Treatment/Interventions ADLs/Self Care Home Management;Ultrasound;Moist Heat;Gait training;Stair training;Functional mobility training;Therapeutic activities;Therapeutic exercise;Neuromuscular re-education;Manual techniques;Patient/family education;Passive range of motion;Balance training;Scar mobilization;Taping;Iontophoresis 4mg /ml Dexamethasone    PT Next Visit Plan bike or nustep, right LE strengthening, balance, proprioception to tolerance. No modalities per secondary insurance    PT Home Exercise Plan see patient education section    Consulted and Agree with Plan of Care Patient           Patient will benefit from skilled therapeutic intervention in order to improve the  following deficits and impairments:  Abnormal gait,Difficulty walking,Decreased activity tolerance,Decreased strength,Increased edema,Decreased range of motion,Pain,Decreased balance  Visit Diagnosis: Muscle weakness (generalized)  Localized edema  Chronic pain of right knee  Stiffness of right knee, not elsewhere classified     Problem List There are no problems to display for this patient.   , PTA 03/17/2020, 12:10 PM  [facility] [address] [place], [place], [place] Phone: [phone]  Fax:  [phone]  Name: Jamie Doyle MRN: [id number] Date of Birth: November 02, 1975

## 2020-03-19 ENCOUNTER — Ambulatory Visit: Payer: BC Managed Care – PPO | Admitting: Physical Therapy

## 2020-03-23 DIAGNOSIS — J4540 Moderate persistent asthma, uncomplicated: Secondary | ICD-10-CM | POA: Diagnosis not present

## 2020-03-23 DIAGNOSIS — U071 COVID-19: Secondary | ICD-10-CM | POA: Diagnosis not present

## 2020-03-24 ENCOUNTER — Ambulatory Visit: Payer: BC Managed Care – PPO | Attending: Sports Medicine | Admitting: Physical Therapy

## 2020-03-24 ENCOUNTER — Encounter: Payer: Self-pay | Admitting: Physical Therapy

## 2020-03-24 ENCOUNTER — Other Ambulatory Visit: Payer: Self-pay

## 2020-03-24 DIAGNOSIS — G8929 Other chronic pain: Secondary | ICD-10-CM | POA: Diagnosis not present

## 2020-03-24 DIAGNOSIS — R600 Localized edema: Secondary | ICD-10-CM | POA: Insufficient documentation

## 2020-03-24 DIAGNOSIS — M6281 Muscle weakness (generalized): Secondary | ICD-10-CM | POA: Insufficient documentation

## 2020-03-24 DIAGNOSIS — M25561 Pain in right knee: Secondary | ICD-10-CM | POA: Insufficient documentation

## 2020-03-24 DIAGNOSIS — M25661 Stiffness of right knee, not elsewhere classified: Secondary | ICD-10-CM | POA: Insufficient documentation

## 2020-03-24 NOTE — Therapy (Signed)
[facility] [address] [place], [place], [address] Phone: [phone]   Fax:  [phone]  Physical Therapy Treatment  Patient Details  Name: Jamie Doyle MRN: [id number] Date of Birth: November 09, 1975 Referring Provider (PT): Elinor Dodge, MD   Encounter Date: 03/24/2020   PT End of Session - 03/24/20 1308    Visit Number 6    Number of Visits 8    Date for PT Re-Evaluation 04/17/20    Authorization Type BCBS; Medicaid, Progress note every 10th visit    PT Start Time 1300    PT Stop Time 1355    PT Time Calculation (min) 55 min    Activity Tolerance Patient tolerated treatment well    Behavior During Therapy [facility] for tasks assessed/performed           History reviewed. No pertinent past medical history.  History reviewed. No pertinent surgical history.  There were no vitals filed for this visit.   Subjective Assessment - 03/24/20 1303    Subjective COVID 19 screening performed on patient upon arrival. Patient reports feeling stronger but sore to touch to incision area.    Pertinent History RIGHT knee arthroscopy with allograft ACL reconstruction, allograft MCL reconstruction, partial lateral meniscectomy, and debridement/chondroplasty 10/10/2019, Asthma, HTN, Cholecystectomy, tubal ligation.    Limitations Sitting;Standing;Walking;House hold activities    How long can you sit comfortably? "stiff after sitting for long periods"    How long can you walk comfortably? 20-30 minutes    Patient Stated Goals Decrease pain, improve movement, Get back to usual routine    Currently in Pain? Yes    Pain Orientation Right    Pain Descriptors / Indicators Sore;Tender    Pain Type Surgical pain    Pain Onset More than a month ago    Pain Frequency Intermittent              [facility] PT Assessment - 03/24/20 0001      Assessment   Medical Diagnosis S/P AC reconstruction, right    Referring Provider (PT) Elinor Dodge, MD     Onset Date/Surgical Date 10/10/19    Next MD Visit 04/08/2020    Prior Therapy yes      Precautions   Precautions None                         [facility] Adult PT Treatment/Exercise - 03/24/20 0001      Knee/Hip Exercises: Aerobic   Recumbent Bike L4, seat 3 x15 min      Knee/Hip Exercises: Machines for Strengthening   Cybex Knee Extension 20# 2x10 reps 10# 1x10 eccentric control    Cybex Knee Flexion 40# 2x10 reps; 30# 1x10 eccentric control    Cybex Leg Press 2.5 pl, seat 7 2x10 reps; 2 plates E72 for eccentric control with L toe touchdown      Knee/Hip Exercises: Standing   Forward Step Up Right;2 sets;10 reps;Hand Hold: 2;Step Height: 8"    Forward Step Up Limitations 2# weight    Step Down Right;10 reps;Hand Hold: 2;Step Height: 4"    Step Down Limitations with 2# weight in bilateral      Modalities   Modalities --      Vasopneumatic   Number Minutes Vasopneumatic  --    Vasopnuematic Location  --    Vasopneumatic Pressure --    Vasopneumatic Temperature  --      Manual Therapy   Manual Therapy Myofascial release  Myofascial Release IASTM to right medial knee along incision area and around medial aspect of the patella to break up scar tissue                    PT Short Term Goals - 02/26/20 1004      PT SHORT TERM GOAL #1   Title Patient will be independent with HEP    Baseline no knowledge of HEP    Time 2    Period Weeks    Status On-going             PT Long Term Goals - 02/26/20 1003      PT LONG TERM GOAL #1   Title Patient will be independent with advanced HEP    Baseline No knowledge of appropriate advanced HEP    Time 4    Period Weeks    Status On-going      PT LONG TERM GOAL #2   Title Patient will demonstrate 4+/5 or greater right LE MMT to improve stability during functional tasks.    Baseline 3+/5 to 4/5 right LE MMT    Time 4    Period Weeks    Status On-going      PT LONG TERM GOAL #3   Title Patient  will report ability to negotiate steps with one railing or no railing with reciprocal gait pattern and right knee pain less than or equal to 3/10 to safely access areas of her home.    Baseline pain up to 5-7/10 in right knee    Time 4    Period Weeks    Status On-going      PT LONG TERM GOAL #4   Title Patient will demonsrate proper squat mechanics with right knee pain less than or equal to 3/10 to safely perform home activities.    Baseline up to 7/10 pain with squatting.    Time 4    Period Weeks    Status On-going      PT LONG TERM GOAL #5   Title Paitent will report ability to ambulate community distances with right knee pain less than or equal to 3/10 and minimal reports of LE fatigue.    Baseline 20-30 min walking tolerance    Time 4    Period Weeks    Status On-going                 Plan - 03/24/20 1436    Clinical Impression Statement Patient arrives to physical therapy feeling stronger in right knee. Patient guided through TEs with progression to eccentric control with leg machine and leg press though with reports of fatigue. Notable muscle fasciculations noted with eccentric exercises. IASTM performed to medial right knee with erythema particularly around medial patella. Patient instructed she may be red and possibly bruised to which she reported understanding.    Comorbidities RIGHT knee arthroscopy with allograft ACL reconstruction, allograft MCL reconstruction, partial lateral meniscectomy, and debridement/chondroplasty 10/10/2019,Asthma, HTN, Cholecystectomy, tubal ligation.    Examination-Activity Limitations Locomotion Level;Other;Hygiene/Grooming;Dressing;Stand;Stairs;Squat;Sleep;Bend;Transfers;Bed Mobility    Examination-Participation Restrictions Community Activity;Other    Stability/Clinical Decision Making Stable/Uncomplicated    Rehab Potential Good    PT Frequency 3x / week    PT Duration 6 weeks    PT Treatment/Interventions ADLs/Self Care Home  Management;Ultrasound;Moist Heat;Gait training;Stair training;Functional mobility training;Therapeutic activities;Therapeutic exercise;Neuromuscular re-education;Manual techniques;Patient/family education;Passive range of motion;Balance training;Scar mobilization;Taping;Iontophoresis 4mg /ml Dexamethasone    PT Next Visit Plan practice kneeling and weighted step up and step downs; bike  or nustep, right LE strengthening, balance, proprioception to tolerance. No modalities per secondary insurance    PT Home Exercise Plan see patient education section    Consulted and Agree with Plan of Care Patient           Patient will benefit from skilled therapeutic intervention in order to improve the following deficits and impairments:  Abnormal gait,Difficulty walking,Decreased activity tolerance,Decreased strength,Increased edema,Decreased range of motion,Pain,Decreased balance  Visit Diagnosis: Muscle weakness (generalized)  Localized edema  Chronic pain of right knee  Stiffness of right knee, not elsewhere classified     Problem List There are no problems to display for this patient.   Guss Bunde, PT, DPT 03/24/2020, 7:53 PM  [facility] [address] [place], [place], [address] Phone: [phone]   Fax:  [phone]  Name: Jamie Doyle MRN: [id number] Date of Birth: 1975/07/20

## 2020-03-29 DIAGNOSIS — J4540 Moderate persistent asthma, uncomplicated: Secondary | ICD-10-CM | POA: Diagnosis not present

## 2020-03-29 DIAGNOSIS — U071 COVID-19: Secondary | ICD-10-CM | POA: Diagnosis not present

## 2020-03-30 ENCOUNTER — Other Ambulatory Visit: Payer: Self-pay

## 2020-03-30 ENCOUNTER — Ambulatory Visit: Payer: BC Managed Care – PPO | Admitting: Physical Therapy

## 2020-03-30 ENCOUNTER — Encounter: Payer: Self-pay | Admitting: Physical Therapy

## 2020-03-30 DIAGNOSIS — M25661 Stiffness of right knee, not elsewhere classified: Secondary | ICD-10-CM | POA: Diagnosis not present

## 2020-03-30 DIAGNOSIS — M6281 Muscle weakness (generalized): Secondary | ICD-10-CM

## 2020-03-30 DIAGNOSIS — G8929 Other chronic pain: Secondary | ICD-10-CM | POA: Diagnosis not present

## 2020-03-30 DIAGNOSIS — M25561 Pain in right knee: Secondary | ICD-10-CM | POA: Diagnosis not present

## 2020-03-30 DIAGNOSIS — R600 Localized edema: Secondary | ICD-10-CM | POA: Diagnosis not present

## 2020-03-30 NOTE — Therapy (Signed)
[facility] [address] [place], [place], [address] Phone: [phone]   Fax:  [phone]  Physical Therapy Treatment  Patient Details  Name: Jamie Doyle MRN: [id number] Date of Birth: May 29, 1975 Referring Provider (PT): Elinor Dodge, MD   Encounter Date: 03/30/2020   PT End of Session - 03/30/20 1349    Visit Number 7    Number of Visits 8    Date for PT Re-Evaluation 04/17/20    Authorization Type BCBS; Medicaid, Progress note every 10th visit    PT Start Time 1348    PT Stop Time 1430    PT Time Calculation (min) 42 min    Activity Tolerance Patient tolerated treatment well    Behavior During Therapy [facility] for tasks assessed/performed           History reviewed. No pertinent past medical history.  History reviewed. No pertinent surgical history.  There were no vitals filed for this visit.   Subjective Assessment - 03/30/20 1348    Subjective COVID 19 screening performed on patient upon arrival.    Pertinent History RIGHT knee arthroscopy with allograft ACL reconstruction, allograft MCL reconstruction, partial lateral meniscectomy, and debridement/chondroplasty 10/10/2019, Asthma, HTN, Cholecystectomy, tubal ligation.    Limitations Sitting;Standing;Walking;House hold activities    How long can you sit comfortably? "stiff after sitting for long periods"    How long can you stand comfortably? 20 minutes    How long can you walk comfortably? 20-30 minutes    Patient Stated Goals Decrease pain, improve movement, Get back to usual routine              [facility] PT Assessment - 03/30/20 0001      Assessment   Medical Diagnosis S/P AC reconstruction, right    Referring Provider (PT) Elinor Dodge, MD    Onset Date/Surgical Date 10/10/19    Next MD Visit 04/08/2020    Prior Therapy yes      Precautions   Precautions None      Restrictions   Weight Bearing Restrictions No                          [facility] Adult PT Treatment/Exercise - 03/30/20 0001      Knee/Hip Exercises: Aerobic   Recumbent Bike L5 x15 reps      Knee/Hip Exercises: Machines for Strengthening   Cybex Knee Extension 20# 3x10 reps LLE eccentric return    Cybex Knee Flexion 40# 3x10 reps LLE eccentric return    Cybex Leg Press 3 pl, seat 4 x30 reps      Knee/Hip Exercises: Standing   Wall Squat 10 reps;5 seconds    Walking with Sports Cord 4D Orange XTS walk x10 reps each                    PT Short Term Goals - 03/30/20 1438      PT SHORT TERM GOAL #1   Title Patient will be independent with HEP    Baseline no knowledge of HEP    Time 2    Period Weeks    Status Achieved             PT Long Term Goals - 02/26/20 1003      PT LONG TERM GOAL #1   Title Patient will be independent with advanced HEP    Baseline No knowledge of appropriate advanced HEP    Time 4    Period Weeks  Status On-going      PT LONG TERM GOAL #2   Title Patient will demonstrate 4+/5 or greater right LE MMT to improve stability during functional tasks.    Baseline 3+/5 to 4/5 right LE MMT    Time 4    Period Weeks    Status On-going      PT LONG TERM GOAL #3   Title Patient will report ability to negotiate steps with one railing or no railing with reciprocal gait pattern and right knee pain less than or equal to 3/10 to safely access areas of her home.    Baseline pain up to 5-7/10 in right knee    Time 4    Period Weeks    Status On-going      PT LONG TERM GOAL #4   Title Patient will demonsrate proper squat mechanics with right knee pain less than or equal to 3/10 to safely perform home activities.    Baseline up to 7/10 pain with squatting.    Time 4    Period Weeks    Status On-going      PT LONG TERM GOAL #5   Title Paitent will report ability to ambulate community distances with right knee pain less than or equal to 3/10 and minimal reports of LE fatigue.    Baseline  20-30 min walking tolerance    Time 4    Period Weeks    Status On-going                 Plan - 03/30/20 1434    Clinical Impression Statement Patient presented in clinic with progression of resistance to progress towards full function. Patient able to tolerate therex fairly well although still fatiguing fairly quickly. No complaints of diffculty with any ADLs at this time.    Comorbidities RIGHT knee arthroscopy with allograft ACL reconstruction, allograft MCL reconstruction, partial lateral meniscectomy, and debridement/chondroplasty 10/10/2019,Asthma, HTN, Cholecystectomy, tubal ligation.    Examination-Activity Limitations Locomotion Level;Other;Hygiene/Grooming;Dressing;Stand;Stairs;Squat;Sleep;Bend;Transfers;Bed Mobility    Examination-Participation Restrictions Community Activity;Other    Stability/Clinical Decision Making Stable/Uncomplicated    Rehab Potential Good    PT Frequency 3x / week    PT Duration 6 weeks    PT Treatment/Interventions ADLs/Self Care Home Management;Ultrasound;Moist Heat;Gait training;Stair training;Functional mobility training;Therapeutic activities;Therapeutic exercise;Neuromuscular re-education;Manual techniques;Patient/family education;Passive range of motion;Balance training;Scar mobilization;Taping;Iontophoresis 4mg /ml Dexamethasone    PT Next Visit Plan practice kneeling and weighted step up and step downs; practice kneeling as well.    PT Home Exercise Plan see patient education section    Consulted and Agree with Plan of Care Patient           Patient will benefit from skilled therapeutic intervention in order to improve the following deficits and impairments:  Abnormal gait,Difficulty walking,Decreased activity tolerance,Decreased strength,Increased edema,Decreased range of motion,Pain,Decreased balance  Visit Diagnosis: Muscle weakness (generalized)  Localized edema  Chronic pain of right knee  Stiffness of right knee, not elsewhere  classified     Problem List There are no problems to display for this patient.   , PTA 03/30/2020, 2:38 PM  [facility] [address] [place], [place], [place] Phone: [phone]   Fax:  [phone]  Name: Jamie Doyle MRN: Orpah Melter Date of Birth: 1975-09-09

## 2020-04-02 DIAGNOSIS — Z975 Presence of (intrauterine) contraceptive device: Secondary | ICD-10-CM | POA: Diagnosis not present

## 2020-04-02 DIAGNOSIS — Z01419 Encounter for gynecological examination (general) (routine) without abnormal findings: Secondary | ICD-10-CM | POA: Diagnosis not present

## 2020-04-02 DIAGNOSIS — Z8041 Family history of malignant neoplasm of ovary: Secondary | ICD-10-CM | POA: Diagnosis not present

## 2020-04-02 DIAGNOSIS — N951 Menopausal and female climacteric states: Secondary | ICD-10-CM | POA: Diagnosis not present

## 2020-04-07 ENCOUNTER — Encounter: Payer: Self-pay | Admitting: Physical Therapy

## 2020-04-07 ENCOUNTER — Other Ambulatory Visit: Payer: Self-pay

## 2020-04-07 ENCOUNTER — Ambulatory Visit: Payer: BC Managed Care – PPO | Admitting: Physical Therapy

## 2020-04-07 DIAGNOSIS — M6281 Muscle weakness (generalized): Secondary | ICD-10-CM | POA: Diagnosis not present

## 2020-04-07 DIAGNOSIS — M25561 Pain in right knee: Secondary | ICD-10-CM

## 2020-04-07 DIAGNOSIS — R600 Localized edema: Secondary | ICD-10-CM

## 2020-04-07 DIAGNOSIS — G8929 Other chronic pain: Secondary | ICD-10-CM

## 2020-04-07 DIAGNOSIS — M25661 Stiffness of right knee, not elsewhere classified: Secondary | ICD-10-CM | POA: Diagnosis not present

## 2020-04-07 NOTE — Therapy (Signed)
[facility] [place], [place], [address] Phone: [phone]   Fax:  [phone]  Physical Therapy Treatment  Patient Details  Name: Jamie Doyle MRN: [id number] Date of Birth: 1975-10-13 Referring Provider (PT): Alphonzo Grieve, MD   Encounter Date: 04/07/2020   PT End of Session - 04/07/20 1118    Visit Number 8    Number of Visits 8    Date for PT Re-Evaluation 04/17/20    Authorization Type BCBS; Medicaid, Progress note every 10th visit    PT Start Time 1116    PT Stop Time 1200    PT Time Calculation (min) 44 min    Activity Tolerance Patient tolerated treatment well    Behavior During Therapy [facility] for tasks assessed/performed           History reviewed. No pertinent past medical history.  History reviewed. No pertinent surgical history.  There were no vitals filed for this visit.   Subjective Assessment - 04/07/20 1116    Subjective COVID 19 screening performed on patient upon arrival. has figured out a way to get up and has changed how she gets up at their play.    Pertinent History RIGHT knee arthroscopy with allograft ACL reconstruction, allograft MCL reconstruction, partial lateral meniscectomy, and debridement/chondroplasty 10/10/2019, Asthma, HTN, Cholecystectomy, tubal ligation.    Limitations Sitting;Standing;Walking;House hold activities    How long can you sit comfortably? "stiff after sitting for long periods"    How long can you stand comfortably? 20 minutes    How long can you walk comfortably? 20-30 minutes    Patient Stated Goals Decrease pain, improve movement, Get back to usual routine    Currently in Pain? Other (Comment)   No pain assessment             [facility] PT Assessment - 04/07/20 0001      Assessment   Medical Diagnosis S/P AC reconstruction, right    Referring Provider (PT) Alphonzo Grieve, MD    Onset Date/Surgical Date 10/10/19    Next MD Visit 04/08/2020    Prior  Therapy yes      Precautions   Precautions None      Restrictions   Weight Bearing Restrictions No      ROM / Strength   AROM / PROM / Strength Strength      Strength   Overall Strength Within functional limits for tasks performed    Strength Assessment Site Hip;Knee    Right/Left Hip Right    Right Hip Flexion 4+/5    Right Hip ABduction 4+/5    Right/Left Knee Right    Right Knee Flexion 4+/5    Right Knee Extension 4+/5                         [facility] Adult PT Treatment/Exercise - 04/07/20 0001      Ambulation/Gait   Stairs Yes    Stairs Assistance 6: Modified independent (Device/Increase time)    Stair Management Technique One rail Right;Alternating pattern;Forwards    Number of Stairs 4   x2 RT   Height of Stairs 6.5      Therapeutic Activites    Therapeutic Activities ADL's    ADL's Practicing kneeling to 8" step and airex; practicing technique for getting off the floor; squatting      Knee/Hip Exercises: Aerobic   Elliptical L3, R3 x6 min    Recumbent Bike L5 x12 min  Knee/Hip Exercises: Machines for Strengthening   Cybex Leg Press 3.5 pl, seat 5 x30 reps      Knee/Hip Exercises: Standing   Functional Squat --                    PT Short Term Goals - 03/30/20 1438      PT SHORT TERM GOAL #1   Title Patient will be independent with HEP    Baseline no knowledge of HEP    Time 2    Period Weeks    Status Achieved             PT Long Term Goals - 04/07/20 1134      PT LONG TERM GOAL #1   Title Patient will be independent with advanced HEP    Baseline No knowledge of appropriate advanced HEP    Time 4    Period Weeks    Status Unable to assess      PT LONG TERM GOAL #2   Title Patient will demonstrate 4+/5 or greater right LE MMT to improve stability during functional tasks.    Baseline 3+/5 to 4/5 right LE MMT    Time 4    Period Weeks    Status Achieved      PT LONG TERM GOAL #3   Title Patient will report  ability to negotiate steps with one railing or no railing with reciprocal gait pattern and right knee pain less than or equal to 3/10 to safely access areas of her home.    Baseline pain up to 5-7/10 in right knee    Time 4    Period Weeks    Status Partially Met   4/10 R knee pain 04/07/2020     PT LONG TERM GOAL #4   Title Patient will demonsrate proper squat mechanics with right knee pain less than or equal to 3/10 to safely perform home activities.    Baseline up to 7/10 pain with squatting.    Time 4    Period Weeks    Status Partially Met   More cueing but 5/10 R knee pain 04/08/2020     PT LONG TERM GOAL #5   Title Paitent will report ability to ambulate community distances with right knee pain less than or equal to 3/10 and minimal reports of LE fatigue.    Baseline 20-30 min walking tolerance    Time 4    Period Weeks    Status Partially Met   Most days less than 3/10 but pain depending 04/07/2020                Plan - 04/07/20 1209    Clinical Impression Statement Patient presented in clinic with self reported improvement in regards to LE strengthening and function. Patient continues to have difficulty with carrying heavy items down stairs but without an object is functional ascending and descending reciprically with 4/10 R knee pain. Patient able to achieve LLE MMT goal today. Patient also educated to more easily rise from the floor and improve squat technique.    Comorbidities RIGHT knee arthroscopy with allograft ACL reconstruction, allograft MCL reconstruction, partial lateral meniscectomy, and debridement/chondroplasty 10/10/2019,Asthma, HTN, Cholecystectomy, tubal ligation.    Examination-Activity Limitations Locomotion Level;Other;Hygiene/Grooming;Dressing;Stand;Stairs;Squat;Sleep;Bend;Transfers;Bed Mobility    Examination-Participation Restrictions Community Activity;Other    Stability/Clinical Decision Making Stable/Uncomplicated    Rehab Potential Good    PT  Frequency 3x / week    PT Duration 6 weeks    PT Treatment/Interventions ADLs/Self  Care Home Management;Ultrasound;Moist Heat;Gait training;Stair training;Functional mobility training;Therapeutic activities;Therapeutic exercise;Neuromuscular re-education;Manual techniques;Patient/family education;Passive range of motion;Balance training;Scar mobilization;Taping;Iontophoresis 54m/ml Dexamethasone    PT Next Visit Plan practice kneeling and weighted step up and step downs; practice kneeling as well.    PT Home Exercise Plan see patient education section    Consulted and Agree with Plan of Care Patient           Patient will benefit from skilled therapeutic intervention in order to improve the following deficits and impairments:  Abnormal gait,Difficulty walking,Decreased activity tolerance,Decreased strength,Increased edema,Decreased range of motion,Pain,Decreased balance  Visit Diagnosis: Muscle weakness (generalized)  Localized edema  Chronic pain of right knee  Stiffness of right knee, not elsewhere classified     Problem List There are no problems to display for this patient.   KStandley Brooking PTA 04/07/20 12:29 PM  [facility] [address] [address]Phone: [phone]  Fax:  [phone] Name: Jamie FontMRN: [id number] of Birth: 1Jan 15, 1977

## 2020-04-08 DIAGNOSIS — Z9889 Other specified postprocedural states: Secondary | ICD-10-CM | POA: Diagnosis not present

## 2020-04-08 DIAGNOSIS — G894 Chronic pain syndrome: Secondary | ICD-10-CM | POA: Diagnosis not present

## 2020-04-08 DIAGNOSIS — M1712 Unilateral primary osteoarthritis, left knee: Secondary | ICD-10-CM | POA: Diagnosis not present

## 2020-04-08 DIAGNOSIS — R6889 Other general symptoms and signs: Secondary | ICD-10-CM | POA: Diagnosis not present

## 2020-04-16 DIAGNOSIS — Z6836 Body mass index (BMI) 36.0-36.9, adult: Secondary | ICD-10-CM | POA: Diagnosis not present

## 2020-04-16 DIAGNOSIS — J4520 Mild intermittent asthma, uncomplicated: Secondary | ICD-10-CM | POA: Diagnosis not present

## 2020-04-22 DIAGNOSIS — J4540 Moderate persistent asthma, uncomplicated: Secondary | ICD-10-CM | POA: Diagnosis not present

## 2020-04-22 DIAGNOSIS — U071 COVID-19: Secondary | ICD-10-CM | POA: Diagnosis not present

## 2020-04-29 DIAGNOSIS — U071 COVID-19: Secondary | ICD-10-CM | POA: Diagnosis not present

## 2020-04-29 DIAGNOSIS — J4540 Moderate persistent asthma, uncomplicated: Secondary | ICD-10-CM | POA: Diagnosis not present

## 2020-05-11 DIAGNOSIS — M799 Soft tissue disorder, unspecified: Secondary | ICD-10-CM | POA: Diagnosis not present

## 2020-05-11 DIAGNOSIS — S93402A Sprain of unspecified ligament of left ankle, initial encounter: Secondary | ICD-10-CM | POA: Diagnosis not present

## 2020-05-11 DIAGNOSIS — M25572 Pain in left ankle and joints of left foot: Secondary | ICD-10-CM | POA: Diagnosis not present

## 2020-05-11 DIAGNOSIS — J45909 Unspecified asthma, uncomplicated: Secondary | ICD-10-CM | POA: Diagnosis not present

## 2020-05-11 DIAGNOSIS — W19XXXA Unspecified fall, initial encounter: Secondary | ICD-10-CM | POA: Diagnosis not present

## 2020-05-11 DIAGNOSIS — M7989 Other specified soft tissue disorders: Secondary | ICD-10-CM | POA: Diagnosis not present

## 2020-05-13 DIAGNOSIS — N951 Menopausal and female climacteric states: Secondary | ICD-10-CM | POA: Diagnosis not present

## 2020-05-13 DIAGNOSIS — Z6837 Body mass index (BMI) 37.0-37.9, adult: Secondary | ICD-10-CM | POA: Diagnosis not present

## 2020-05-19 DIAGNOSIS — Z881 Allergy status to other antibiotic agents status: Secondary | ICD-10-CM | POA: Diagnosis not present

## 2020-05-19 DIAGNOSIS — Z885 Allergy status to narcotic agent status: Secondary | ICD-10-CM | POA: Diagnosis not present

## 2020-05-19 DIAGNOSIS — Z880 Allergy status to penicillin: Secondary | ICD-10-CM | POA: Diagnosis not present

## 2020-05-19 DIAGNOSIS — M1712 Unilateral primary osteoarthritis, left knee: Secondary | ICD-10-CM | POA: Diagnosis not present

## 2020-05-19 DIAGNOSIS — M170 Bilateral primary osteoarthritis of knee: Secondary | ICD-10-CM | POA: Diagnosis not present

## 2020-05-19 DIAGNOSIS — G588 Other specified mononeuropathies: Secondary | ICD-10-CM | POA: Diagnosis not present

## 2020-05-19 DIAGNOSIS — Z79899 Other long term (current) drug therapy: Secondary | ICD-10-CM | POA: Diagnosis not present

## 2020-05-19 DIAGNOSIS — Z791 Long term (current) use of non-steroidal anti-inflammatories (NSAID): Secondary | ICD-10-CM | POA: Diagnosis not present

## 2020-05-23 DIAGNOSIS — U071 COVID-19: Secondary | ICD-10-CM | POA: Diagnosis not present

## 2020-05-23 DIAGNOSIS — J4540 Moderate persistent asthma, uncomplicated: Secondary | ICD-10-CM | POA: Diagnosis not present

## 2020-05-27 DIAGNOSIS — K219 Gastro-esophageal reflux disease without esophagitis: Secondary | ICD-10-CM | POA: Diagnosis not present

## 2020-05-27 DIAGNOSIS — E1169 Type 2 diabetes mellitus with other specified complication: Secondary | ICD-10-CM | POA: Diagnosis not present

## 2020-05-27 DIAGNOSIS — J4520 Mild intermittent asthma, uncomplicated: Secondary | ICD-10-CM | POA: Diagnosis not present

## 2020-05-27 DIAGNOSIS — F411 Generalized anxiety disorder: Secondary | ICD-10-CM | POA: Diagnosis not present

## 2020-05-29 DIAGNOSIS — U071 COVID-19: Secondary | ICD-10-CM | POA: Diagnosis not present

## 2020-05-29 DIAGNOSIS — J4540 Moderate persistent asthma, uncomplicated: Secondary | ICD-10-CM | POA: Diagnosis not present

## 2020-06-22 DIAGNOSIS — U071 COVID-19: Secondary | ICD-10-CM | POA: Diagnosis not present

## 2020-06-22 DIAGNOSIS — J4540 Moderate persistent asthma, uncomplicated: Secondary | ICD-10-CM | POA: Diagnosis not present

## 2020-06-29 DIAGNOSIS — U071 COVID-19: Secondary | ICD-10-CM | POA: Diagnosis not present

## 2020-06-29 DIAGNOSIS — J4540 Moderate persistent asthma, uncomplicated: Secondary | ICD-10-CM | POA: Diagnosis not present

## 2020-07-09 DIAGNOSIS — M1712 Unilateral primary osteoarthritis, left knee: Secondary | ICD-10-CM | POA: Diagnosis not present

## 2020-07-09 DIAGNOSIS — Z5181 Encounter for therapeutic drug level monitoring: Secondary | ICD-10-CM | POA: Diagnosis not present

## 2020-07-09 DIAGNOSIS — G894 Chronic pain syndrome: Secondary | ICD-10-CM | POA: Diagnosis not present

## 2020-07-09 DIAGNOSIS — Z79891 Long term (current) use of opiate analgesic: Secondary | ICD-10-CM | POA: Diagnosis not present

## 2020-07-23 DIAGNOSIS — J4540 Moderate persistent asthma, uncomplicated: Secondary | ICD-10-CM | POA: Diagnosis not present

## 2020-07-23 DIAGNOSIS — U071 COVID-19: Secondary | ICD-10-CM | POA: Diagnosis not present

## 2020-07-29 DIAGNOSIS — U071 COVID-19: Secondary | ICD-10-CM | POA: Diagnosis not present

## 2020-07-29 DIAGNOSIS — J4540 Moderate persistent asthma, uncomplicated: Secondary | ICD-10-CM | POA: Diagnosis not present

## 2020-08-08 ENCOUNTER — Emergency Department (HOSPITAL_COMMUNITY)
Admission: EM | Admit: 2020-08-08 | Discharge: 2020-08-08 | Disposition: A | Discharge status: Home / Self Care | Payer: BC Managed Care – PPO | Attending: Emergency Medicine | Admitting: Emergency Medicine

## 2020-08-08 ENCOUNTER — Other Ambulatory Visit: Payer: Self-pay

## 2020-08-08 ENCOUNTER — Encounter (HOSPITAL_COMMUNITY): Payer: Self-pay

## 2020-08-08 ENCOUNTER — Emergency Department (HOSPITAL_COMMUNITY): Payer: BC Managed Care – PPO

## 2020-08-08 DIAGNOSIS — R0602 Shortness of breath: Secondary | ICD-10-CM

## 2020-08-08 DIAGNOSIS — R000 Tachycardia, unspecified: Secondary | ICD-10-CM | POA: Diagnosis not present

## 2020-08-08 DIAGNOSIS — I10 Essential (primary) hypertension: Secondary | ICD-10-CM | POA: Diagnosis not present

## 2020-08-08 DIAGNOSIS — J45909 Unspecified asthma, uncomplicated: Secondary | ICD-10-CM | POA: Diagnosis not present

## 2020-08-08 DIAGNOSIS — U071 COVID-19: Secondary | ICD-10-CM | POA: Insufficient documentation

## 2020-08-08 DIAGNOSIS — R0600 Dyspnea, unspecified: Secondary | ICD-10-CM | POA: Diagnosis not present

## 2020-08-08 HISTORY — DX: Unspecified asthma, uncomplicated: J45.909

## 2020-08-08 HISTORY — DX: Essential (primary) hypertension: I10

## 2020-08-08 LAB — RESP PANEL BY RT-PCR (FLU A&B, COVID) ARPGX2
Influenza A by PCR: NEGATIVE
Influenza B by PCR: NEGATIVE
SARS Coronavirus 2 by RT PCR: POSITIVE — AB

## 2020-08-08 MED ORDER — PREDNISONE 50 MG PO TABS
60.0000 mg | ORAL_TABLET | Freq: Once | ORAL | Status: AC
  Administered 2020-08-08: 60 mg via ORAL
  Filled 2020-08-08: qty 1

## 2020-08-08 MED ORDER — IPRATROPIUM-ALBUTEROL 0.5-2.5 (3) MG/3ML IN SOLN: 3.0000 mL | Freq: Once | RESPIRATORY_TRACT | Status: DC

## 2020-08-08 MED ORDER — NIRMATRELVIR/RITONAVIR (PAXLOVID)TABLET: 3.0000 | ORAL_TABLET | Freq: Two times a day (BID) | ORAL | 0 refills | Status: AC

## 2020-08-08 MED ORDER — PREDNISONE 20 MG PO TABS: ORAL_TABLET | ORAL | 0 refills | Status: DC

## 2020-08-08 MED ORDER — IPRATROPIUM BROMIDE 0.02 % IN SOLN
RESPIRATORY_TRACT | Status: AC
  Administered 2020-08-08: 0.5 mg
  Filled 2020-08-08: qty 2.5

## 2020-08-08 MED ORDER — ALBUTEROL SULFATE (2.5 MG/3ML) 0.083% IN NEBU
INHALATION_SOLUTION | RESPIRATORY_TRACT | Status: AC
  Administered 2020-08-08: 2.5 mg
  Filled 2020-08-08: qty 6

## 2020-08-08 NOTE — ED Provider Notes (Signed)
Emergency Medicine Provider Triage Evaluation Note  Jamie Doyle , a 45 y.o. female  was evaluated in triage.  Pt complains of cough, headache, throat tightness, shortness of breath for the past 1 day..  Review of Systems  Positive: Productive cough, headache, chest tightness, wheezing Negative: Fever, abdominal pain,  Physical Exam  BP (!) 135/91 (BP Location: Right Arm)   Pulse (!) 114   Temp 98.5 F (36.9 C) (Oral)   Resp 20   Ht 5\' 8"  (1.727 m)   Wt 103.4 kg   SpO2 94%   BMI 34.67 kg/m  Gen:   Awake, no distress   Resp:  Normal effort.  GoodAir exchange with faint expiratory wheezing in the upper lobes MSK:   Moves extremities without difficulty  Other:  No tongue or lip swelling.  No drooling or trismus.  Medical Decision Making  Medically screening exam initiated at 6:43 AM.  Appropriate orders placed.  Jamie Doyle was informed that the remainder of the evaluation will be completed by another provider, this initial triage assessment does not replace that evaluation, and the importance of remaining in the ED until their evaluation is complete.  Coughing, shortness of breath, coughing, wheezing and chest tightness.  Will give bronchodilators and steroids, obtain chest x-ray   Orpah Melter, MD 08/08/20 [phone]

## 2020-08-08 NOTE — ED Triage Notes (Signed)
Pt states she woke up this morning with shortness of breath and difficulty breathing. Stating that "My throat feels like it is closing up". Pt states she began feeling sick yesterday.

## 2020-08-08 NOTE — Discharge Instructions (Addendum)
Follow-up with your doctor if not improving 

## 2020-08-08 NOTE — ED Provider Notes (Signed)
[facility] EMERGENCY DEPARTMENT Provider Note   CSN: [id number] Arrival date & time: 08/08/20  4193     History Chief Complaint  Patient presents with   Shortness of Breath    Jamie Doyle is a 45 y.o. female.  Patient complains of cough and wheezing.  Patient has a history of asthma.  Her symptoms started yesterday  The history is provided by the patient. No language interpreter was used.  Shortness of Breath Severity:  Moderate Onset quality:  Gradual Duration:  1 day Timing:  Constant Progression:  Unable to specify Chronicity:  Recurrent Context: activity   Relieved by:  Nothing Worsened by:  Nothing Ineffective treatments:  None tried Associated symptoms: wheezing   Associated symptoms: no abdominal pain, no chest pain, no cough, no headaches and no rash       Past Medical History:  Diagnosis Date   Asthma    Hypertension     There are no problems to display for this patient.   No past surgical history on file.   OB History   No obstetric history on file.     No family history on file.  Social History   Tobacco Use   Smoking status: Never   Smokeless tobacco: Never  Substance Use Topics   Alcohol use: Never    Home Medications Prior to Admission medications   Medication Sig Start Date End Date Taking? Authorizing Provider  nirmatrelvir/ritonavir EUA (PAXLOVID) TABS Take 3 tablets by mouth 2 (two) times daily for 5 days. Patient GFR is 60. Take nirmatrelvir (150 mg) two tablets twice daily for 5 days and ritonavir (100 mg) one tablet twice daily for 5 days. 08/08/20 08/13/20 Yes Bethann Berkshire, MD  predniSONE (DELTASONE) 20 MG tablet 2 tabs po daily x 3 days 08/08/20  Yes Bethann Berkshire, MD  Alendronate Sodium (FOSAMAX PO) Take by mouth.    [provider]  Montelukast Sodium (SINGULAIR PO) Take by mouth.    [provider]  oxyCODONE HCl (OXYCONTIN PO) Take by mouth.    [provider]  Pantoprazole Sodium (PROTONIX  PO) Take by mouth.    [provider]    Allergies    Biaxin [clarithromycin], Morphine and related, and Penicillins  Review of Systems   Review of Systems  Constitutional:  Negative for appetite change and fatigue.  HENT:  Negative for congestion, ear discharge and sinus pressure.   Eyes:  Negative for discharge.  Respiratory:  Positive for shortness of breath and wheezing. Negative for cough.   Cardiovascular:  Negative for chest pain.  Gastrointestinal:  Negative for abdominal pain and diarrhea.  Genitourinary:  Negative for frequency and hematuria.  Musculoskeletal:  Negative for back pain.  Skin:  Negative for rash.  Neurological:  Negative for seizures and headaches.  Psychiatric/Behavioral:  Negative for hallucinations.    Physical Exam Updated Vital Signs BP 120/76   Pulse (!) 112   Temp 98.5 F (36.9 C) (Oral)   Resp 20   Ht 5\' 8"  (1.727 m)   Wt 103.4 kg   SpO2 93%   BMI 34.67 kg/m   Physical Exam Vitals and nursing note reviewed.  Constitutional:      Appearance: She is well-developed.  HENT:     Head: Normocephalic.     Nose: Nose normal.  Eyes:     General: No scleral icterus.    Conjunctiva/sclera: Conjunctivae normal.  Neck:     Thyroid: No thyromegaly.  Cardiovascular:     Rate and  Rhythm: Normal rate and regular rhythm.     Heart sounds: No murmur heard.   No friction rub. No gallop.  Pulmonary:     Breath sounds: No stridor. Wheezing present. No rales.  Chest:     Chest wall: No tenderness.  Abdominal:     General: There is no distension.     Tenderness: There is no abdominal tenderness. There is no rebound.  Musculoskeletal:        General: Normal range of motion.     Cervical back: Neck supple.  Lymphadenopathy:     Cervical: No cervical adenopathy.  Skin:    Findings: No erythema or rash.  Neurological:     Mental Status: She is alert and oriented to person, place, and time.     Motor: No abnormal muscle tone.      Coordination: Coordination normal.  Psychiatric:        Behavior: Behavior normal.    ED Results / Procedures / Treatments   Labs (all labs ordered are listed, but only abnormal results are displayed) Labs Reviewed  RESP PANEL BY RT-PCR (FLU A&B, COVID) ARPGX2 - Abnormal; Notable for the following components:      Result Value   SARS Coronavirus 2 by RT PCR POSITIVE (*)    All other components within normal limits    EKG EKG Interpretation  Date/Time:  Saturday August 08 2020 06:36:37 EDT Ventricular Rate:  107 PR Interval:  130 QRS Duration: 92 QT Interval:  318 QTC Calculation: 425 R Axis:   62 Text Interpretation: Sinus tachycardia Low voltage, precordial leads No previous ECGs available Confirmed by Glynn Octave [phone]) on 08/08/2020 6:42:56 AM  Radiology DG Neck Soft Tissue  Result Date: 08/08/2020 CLINICAL DATA:  45 year old female who woke with shortness of breath, "feels like throat is closing up". Began feeling sick yesterday. EXAM: NECK SOFT TISSUES - 1+ VIEW COMPARISON:  Report of [facility] neck CT 03/04/2016 (no images available). FINDINGS: Relatively preserved cervical lordosis. Normal prevertebral soft tissue contour. Contour of the epiglottis and pharynx appears within normal limits. Tracheal air column within normal limits. Negative visible lung apices. Visible osseous structures appear normal for age. IMPRESSION: Negative, epiglottis and pharynx contour within normal limits. Electronically Signed   By: Odessa Fleming M.D.   On: 08/08/2020 08:36   DG Chest 1 View  Result Date: 08/08/2020 CLINICAL DATA:  Pt states she woke up this morning with shortness of breath and difficulty breathing. Stating that "My throat feels like it is closing up". Pt states she began feeling sick yesterday. EXAM: CHEST  1 VIEW COMPARISON:  None. FINDINGS: Normal mediastinum and cardiac silhouette. Normal pulmonary vasculature. No evidence of effusion, infiltrate, or pneumothorax. No  acute bony abnormality. IMPRESSION: No acute cardiopulmonary process. Electronically Signed   By: Genevive Bi M.D.   On: 08/08/2020 08:39    Procedures Procedures   Medications Ordered in ED Medications  ipratropium-albuterol (DUONEB) 0.5-2.5 (3) MG/3ML nebulizer solution 3 mL ( Nebulization Canceled Entry 08/08/20 0647)  predniSONE (DELTASONE) tablet 60 mg (has no administration in time range)  predniSONE (DELTASONE) tablet 60 mg (60 mg Oral Given 08/08/20 0647)  ipratropium (ATROVENT) 0.02 % nebulizer solution (0.5 mg  Given 08/08/20 0648)  albuterol (PROVENTIL) (2.5 MG/3ML) 0.083% nebulizer solution (2.5 mg  Given 08/08/20 8315)    ED Course  I have reviewed the triage vital signs and the nursing notes.  Pertinent labs & imaging results that were available during my care of the patient  were reviewed by me and considered in my medical decision making (see chart for details).    MDM Rules/Calculators/A&P                          Patient with COVID.  Patient also has asthma.  She improved with albuterol.  She will be sent home with baclofen and prednisone and continue to use her inhalers and follow-up with her PCP Final Clinical Impression(s) / ED Diagnoses Final diagnoses:  COVID-19    Rx / DC Orders ED Discharge Orders          Ordered    predniSONE (DELTASONE) 20 MG tablet        08/08/20 0945    nirmatrelvir/ritonavir EUA (PAXLOVID) TABS  2 times daily        08/08/20 0945             Bethann Berkshire, MD 08/08/20 [phone]

## 2020-08-22 DIAGNOSIS — U071 COVID-19: Secondary | ICD-10-CM | POA: Diagnosis not present

## 2020-08-22 DIAGNOSIS — J4540 Moderate persistent asthma, uncomplicated: Secondary | ICD-10-CM | POA: Diagnosis not present

## 2020-08-29 DIAGNOSIS — U071 COVID-19: Secondary | ICD-10-CM | POA: Diagnosis not present

## 2020-08-29 DIAGNOSIS — J4540 Moderate persistent asthma, uncomplicated: Secondary | ICD-10-CM | POA: Diagnosis not present

## 2020-09-02 DIAGNOSIS — Z0000 Encounter for general adult medical examination without abnormal findings: Secondary | ICD-10-CM | POA: Diagnosis not present

## 2020-09-02 DIAGNOSIS — Z6832 Body mass index (BMI) 32.0-32.9, adult: Secondary | ICD-10-CM | POA: Diagnosis not present

## 2020-09-22 DIAGNOSIS — U071 COVID-19: Secondary | ICD-10-CM | POA: Diagnosis not present

## 2020-09-22 DIAGNOSIS — J4540 Moderate persistent asthma, uncomplicated: Secondary | ICD-10-CM | POA: Diagnosis not present

## 2020-09-29 DIAGNOSIS — U071 COVID-19: Secondary | ICD-10-CM | POA: Diagnosis not present

## 2020-09-29 DIAGNOSIS — J4540 Moderate persistent asthma, uncomplicated: Secondary | ICD-10-CM | POA: Diagnosis not present

## 2020-10-23 DIAGNOSIS — J4540 Moderate persistent asthma, uncomplicated: Secondary | ICD-10-CM | POA: Diagnosis not present

## 2020-10-23 DIAGNOSIS — U071 COVID-19: Secondary | ICD-10-CM | POA: Diagnosis not present

## 2020-10-29 DIAGNOSIS — U071 COVID-19: Secondary | ICD-10-CM | POA: Diagnosis not present

## 2020-10-29 DIAGNOSIS — J4540 Moderate persistent asthma, uncomplicated: Secondary | ICD-10-CM | POA: Diagnosis not present

## 2020-11-10 DIAGNOSIS — M1712 Unilateral primary osteoarthritis, left knee: Secondary | ICD-10-CM | POA: Diagnosis not present

## 2020-11-10 DIAGNOSIS — G894 Chronic pain syndrome: Secondary | ICD-10-CM | POA: Diagnosis not present

## 2020-11-22 DIAGNOSIS — J4540 Moderate persistent asthma, uncomplicated: Secondary | ICD-10-CM | POA: Diagnosis not present

## 2020-11-22 DIAGNOSIS — U071 COVID-19: Secondary | ICD-10-CM | POA: Diagnosis not present

## 2020-11-29 DIAGNOSIS — J4540 Moderate persistent asthma, uncomplicated: Secondary | ICD-10-CM | POA: Diagnosis not present

## 2020-11-29 DIAGNOSIS — U071 COVID-19: Secondary | ICD-10-CM | POA: Diagnosis not present

## 2020-12-15 DIAGNOSIS — E1169 Type 2 diabetes mellitus with other specified complication: Secondary | ICD-10-CM | POA: Diagnosis not present

## 2020-12-20 DIAGNOSIS — J4541 Moderate persistent asthma with (acute) exacerbation: Secondary | ICD-10-CM | POA: Diagnosis not present

## 2020-12-20 DIAGNOSIS — J329 Chronic sinusitis, unspecified: Secondary | ICD-10-CM | POA: Diagnosis not present

## 2020-12-20 DIAGNOSIS — R059 Cough, unspecified: Secondary | ICD-10-CM | POA: Diagnosis not present

## 2020-12-20 DIAGNOSIS — R0981 Nasal congestion: Secondary | ICD-10-CM | POA: Diagnosis not present

## 2020-12-23 DIAGNOSIS — J4540 Moderate persistent asthma, uncomplicated: Secondary | ICD-10-CM | POA: Diagnosis not present

## 2020-12-23 DIAGNOSIS — U071 COVID-19: Secondary | ICD-10-CM | POA: Diagnosis not present

## 2021-01-06 DIAGNOSIS — R059 Cough, unspecified: Secondary | ICD-10-CM | POA: Diagnosis not present

## 2021-01-06 DIAGNOSIS — J329 Chronic sinusitis, unspecified: Secondary | ICD-10-CM | POA: Diagnosis not present

## 2021-01-06 DIAGNOSIS — R52 Pain, unspecified: Secondary | ICD-10-CM | POA: Diagnosis not present

## 2021-01-06 DIAGNOSIS — R0981 Nasal congestion: Secondary | ICD-10-CM | POA: Diagnosis not present

## 2021-01-22 DIAGNOSIS — U071 COVID-19: Secondary | ICD-10-CM | POA: Diagnosis not present

## 2021-01-29 DIAGNOSIS — J4540 Moderate persistent asthma, uncomplicated: Secondary | ICD-10-CM | POA: Diagnosis not present

## 2021-01-29 DIAGNOSIS — U071 COVID-19: Secondary | ICD-10-CM | POA: Diagnosis not present

## 2021-02-22 DIAGNOSIS — U071 COVID-19: Secondary | ICD-10-CM | POA: Diagnosis not present

## 2021-02-22 DIAGNOSIS — J4540 Moderate persistent asthma, uncomplicated: Secondary | ICD-10-CM | POA: Diagnosis not present

## 2021-03-01 DIAGNOSIS — U071 COVID-19: Secondary | ICD-10-CM | POA: Diagnosis not present

## 2021-03-01 DIAGNOSIS — J4540 Moderate persistent asthma, uncomplicated: Secondary | ICD-10-CM | POA: Diagnosis not present

## 2021-03-11 DIAGNOSIS — Z20822 Contact with and (suspected) exposure to covid-19: Secondary | ICD-10-CM | POA: Diagnosis not present

## 2021-03-11 DIAGNOSIS — Z6833 Body mass index (BMI) 33.0-33.9, adult: Secondary | ICD-10-CM | POA: Diagnosis not present

## 2021-03-11 DIAGNOSIS — J329 Chronic sinusitis, unspecified: Secondary | ICD-10-CM | POA: Diagnosis not present

## 2021-03-11 DIAGNOSIS — R059 Cough, unspecified: Secondary | ICD-10-CM | POA: Diagnosis not present

## 2021-03-16 DIAGNOSIS — E1169 Type 2 diabetes mellitus with other specified complication: Secondary | ICD-10-CM | POA: Diagnosis not present

## 2021-03-23 DIAGNOSIS — J4540 Moderate persistent asthma, uncomplicated: Secondary | ICD-10-CM | POA: Diagnosis not present

## 2021-03-23 DIAGNOSIS — U071 COVID-19: Secondary | ICD-10-CM | POA: Diagnosis not present

## 2021-03-29 DIAGNOSIS — M1712 Unilateral primary osteoarthritis, left knee: Secondary | ICD-10-CM | POA: Diagnosis not present

## 2021-03-29 DIAGNOSIS — G894 Chronic pain syndrome: Secondary | ICD-10-CM | POA: Diagnosis not present

## 2021-03-29 DIAGNOSIS — Z5181 Encounter for therapeutic drug level monitoring: Secondary | ICD-10-CM | POA: Diagnosis not present

## 2021-03-29 DIAGNOSIS — U071 COVID-19: Secondary | ICD-10-CM | POA: Diagnosis not present

## 2021-03-29 DIAGNOSIS — Z79891 Long term (current) use of opiate analgesic: Secondary | ICD-10-CM | POA: Diagnosis not present

## 2021-04-22 DIAGNOSIS — U071 COVID-19: Secondary | ICD-10-CM | POA: Diagnosis not present

## 2021-04-22 DIAGNOSIS — J4540 Moderate persistent asthma, uncomplicated: Secondary | ICD-10-CM | POA: Diagnosis not present

## 2021-04-29 DIAGNOSIS — J4540 Moderate persistent asthma, uncomplicated: Secondary | ICD-10-CM | POA: Diagnosis not present

## 2021-04-29 DIAGNOSIS — U071 COVID-19: Secondary | ICD-10-CM | POA: Diagnosis not present

## 2021-05-04 DIAGNOSIS — Z880 Allergy status to penicillin: Secondary | ICD-10-CM | POA: Diagnosis not present

## 2021-05-04 DIAGNOSIS — Z7983 Long term (current) use of bisphosphonates: Secondary | ICD-10-CM | POA: Diagnosis not present

## 2021-05-04 DIAGNOSIS — Z791 Long term (current) use of non-steroidal anti-inflammatories (NSAID): Secondary | ICD-10-CM | POA: Diagnosis not present

## 2021-05-04 DIAGNOSIS — G8929 Other chronic pain: Secondary | ICD-10-CM | POA: Diagnosis not present

## 2021-05-04 DIAGNOSIS — M1712 Unilateral primary osteoarthritis, left knee: Secondary | ICD-10-CM | POA: Diagnosis not present

## 2021-05-04 DIAGNOSIS — Z79899 Other long term (current) drug therapy: Secondary | ICD-10-CM | POA: Diagnosis not present

## 2021-05-04 DIAGNOSIS — Z881 Allergy status to other antibiotic agents status: Secondary | ICD-10-CM | POA: Diagnosis not present

## 2021-05-04 DIAGNOSIS — Z7951 Long term (current) use of inhaled steroids: Secondary | ICD-10-CM | POA: Diagnosis not present

## 2021-05-04 DIAGNOSIS — Z885 Allergy status to narcotic agent status: Secondary | ICD-10-CM | POA: Diagnosis not present

## 2021-05-04 DIAGNOSIS — M170 Bilateral primary osteoarthritis of knee: Secondary | ICD-10-CM | POA: Diagnosis not present

## 2021-05-23 DIAGNOSIS — U071 COVID-19: Secondary | ICD-10-CM | POA: Diagnosis not present

## 2021-05-23 DIAGNOSIS — J4540 Moderate persistent asthma, uncomplicated: Secondary | ICD-10-CM | POA: Diagnosis not present

## 2021-05-29 DIAGNOSIS — U071 COVID-19: Secondary | ICD-10-CM | POA: Diagnosis not present

## 2021-05-29 DIAGNOSIS — J4540 Moderate persistent asthma, uncomplicated: Secondary | ICD-10-CM | POA: Diagnosis not present

## 2021-05-30 DIAGNOSIS — B9689 Other specified bacterial agents as the cause of diseases classified elsewhere: Secondary | ICD-10-CM | POA: Diagnosis not present

## 2021-05-30 DIAGNOSIS — J329 Chronic sinusitis, unspecified: Secondary | ICD-10-CM | POA: Diagnosis not present

## 2021-05-30 DIAGNOSIS — R062 Wheezing: Secondary | ICD-10-CM | POA: Diagnosis not present

## 2021-06-15 DIAGNOSIS — E1169 Type 2 diabetes mellitus with other specified complication: Secondary | ICD-10-CM | POA: Diagnosis not present

## 2021-06-22 DIAGNOSIS — J4540 Moderate persistent asthma, uncomplicated: Secondary | ICD-10-CM | POA: Diagnosis not present

## 2021-06-22 DIAGNOSIS — U071 COVID-19: Secondary | ICD-10-CM | POA: Diagnosis not present

## 2021-06-29 DIAGNOSIS — J4540 Moderate persistent asthma, uncomplicated: Secondary | ICD-10-CM | POA: Diagnosis not present

## 2021-06-29 DIAGNOSIS — U071 COVID-19: Secondary | ICD-10-CM | POA: Diagnosis not present

## 2021-07-06 DIAGNOSIS — N6314 Unspecified lump in the right breast, lower inner quadrant: Secondary | ICD-10-CM | POA: Diagnosis not present

## 2021-07-06 DIAGNOSIS — N951 Menopausal and female climacteric states: Secondary | ICD-10-CM | POA: Diagnosis not present

## 2021-07-06 DIAGNOSIS — Z01419 Encounter for gynecological examination (general) (routine) without abnormal findings: Secondary | ICD-10-CM | POA: Diagnosis not present

## 2021-07-06 DIAGNOSIS — Z975 Presence of (intrauterine) contraceptive device: Secondary | ICD-10-CM | POA: Diagnosis not present

## 2021-07-23 DIAGNOSIS — U071 COVID-19: Secondary | ICD-10-CM | POA: Diagnosis not present

## 2021-07-23 DIAGNOSIS — J4540 Moderate persistent asthma, uncomplicated: Secondary | ICD-10-CM | POA: Diagnosis not present

## 2021-07-28 DIAGNOSIS — R922 Inconclusive mammogram: Secondary | ICD-10-CM | POA: Diagnosis not present

## 2021-07-28 DIAGNOSIS — N6315 Unspecified lump in the right breast, overlapping quadrants: Secondary | ICD-10-CM | POA: Diagnosis not present

## 2021-07-28 DIAGNOSIS — N6314 Unspecified lump in the right breast, lower inner quadrant: Secondary | ICD-10-CM | POA: Diagnosis not present

## 2021-07-28 DIAGNOSIS — Z803 Family history of malignant neoplasm of breast: Secondary | ICD-10-CM | POA: Diagnosis not present

## 2021-07-29 DIAGNOSIS — U071 COVID-19: Secondary | ICD-10-CM | POA: Diagnosis not present

## 2021-07-29 DIAGNOSIS — J4540 Moderate persistent asthma, uncomplicated: Secondary | ICD-10-CM | POA: Diagnosis not present

## 2021-08-11 DIAGNOSIS — N6001 Solitary cyst of right breast: Secondary | ICD-10-CM | POA: Diagnosis not present

## 2021-08-19 DIAGNOSIS — M1990 Unspecified osteoarthritis, unspecified site: Secondary | ICD-10-CM | POA: Insufficient documentation

## 2021-08-19 DIAGNOSIS — M1712 Unilateral primary osteoarthritis, left knee: Secondary | ICD-10-CM | POA: Diagnosis not present

## 2021-08-19 DIAGNOSIS — G894 Chronic pain syndrome: Secondary | ICD-10-CM | POA: Diagnosis not present

## 2021-08-22 DIAGNOSIS — J4540 Moderate persistent asthma, uncomplicated: Secondary | ICD-10-CM | POA: Diagnosis not present

## 2021-08-22 DIAGNOSIS — U071 COVID-19: Secondary | ICD-10-CM | POA: Diagnosis not present

## 2021-08-29 DIAGNOSIS — J4540 Moderate persistent asthma, uncomplicated: Secondary | ICD-10-CM | POA: Diagnosis not present

## 2021-08-29 DIAGNOSIS — U071 COVID-19: Secondary | ICD-10-CM | POA: Diagnosis not present

## 2021-09-02 DIAGNOSIS — M25512 Pain in left shoulder: Secondary | ICD-10-CM | POA: Diagnosis not present

## 2021-09-02 DIAGNOSIS — Z6835 Body mass index (BMI) 35.0-35.9, adult: Secondary | ICD-10-CM | POA: Diagnosis not present

## 2021-09-15 DIAGNOSIS — E7849 Other hyperlipidemia: Secondary | ICD-10-CM | POA: Diagnosis not present

## 2021-09-15 DIAGNOSIS — M25512 Pain in left shoulder: Secondary | ICD-10-CM | POA: Diagnosis not present

## 2021-09-15 DIAGNOSIS — J3081 Allergic rhinitis due to animal (cat) (dog) hair and dander: Secondary | ICD-10-CM | POA: Diagnosis not present

## 2021-09-15 DIAGNOSIS — Z6835 Body mass index (BMI) 35.0-35.9, adult: Secondary | ICD-10-CM | POA: Diagnosis not present

## 2021-09-15 DIAGNOSIS — Z0000 Encounter for general adult medical examination without abnormal findings: Secondary | ICD-10-CM | POA: Diagnosis not present

## 2021-09-22 DIAGNOSIS — J4540 Moderate persistent asthma, uncomplicated: Secondary | ICD-10-CM | POA: Diagnosis not present

## 2021-09-22 DIAGNOSIS — U071 COVID-19: Secondary | ICD-10-CM | POA: Diagnosis not present

## 2021-09-29 DIAGNOSIS — U071 COVID-19: Secondary | ICD-10-CM | POA: Diagnosis not present

## 2021-09-29 DIAGNOSIS — J4540 Moderate persistent asthma, uncomplicated: Secondary | ICD-10-CM | POA: Diagnosis not present

## 2021-10-23 DIAGNOSIS — J4540 Moderate persistent asthma, uncomplicated: Secondary | ICD-10-CM | POA: Diagnosis not present

## 2021-10-23 DIAGNOSIS — U071 COVID-19: Secondary | ICD-10-CM | POA: Diagnosis not present

## 2021-10-27 IMAGING — DX DG NECK SOFT TISSUE
2 series · 2 of 2 positions shown · non-contrast
Comparison: Report of [HOSPITAL] [facility] neck CT 03/04/2016
(no images available).

CLINICAL DATA: 44-year-old female who woke with shortness of
breath, "feels like throat is closing up". Began feeling sick
yesterday.

EXAM:
NECK SOFT TISSUES - 1+ VIEW

[neck lat]
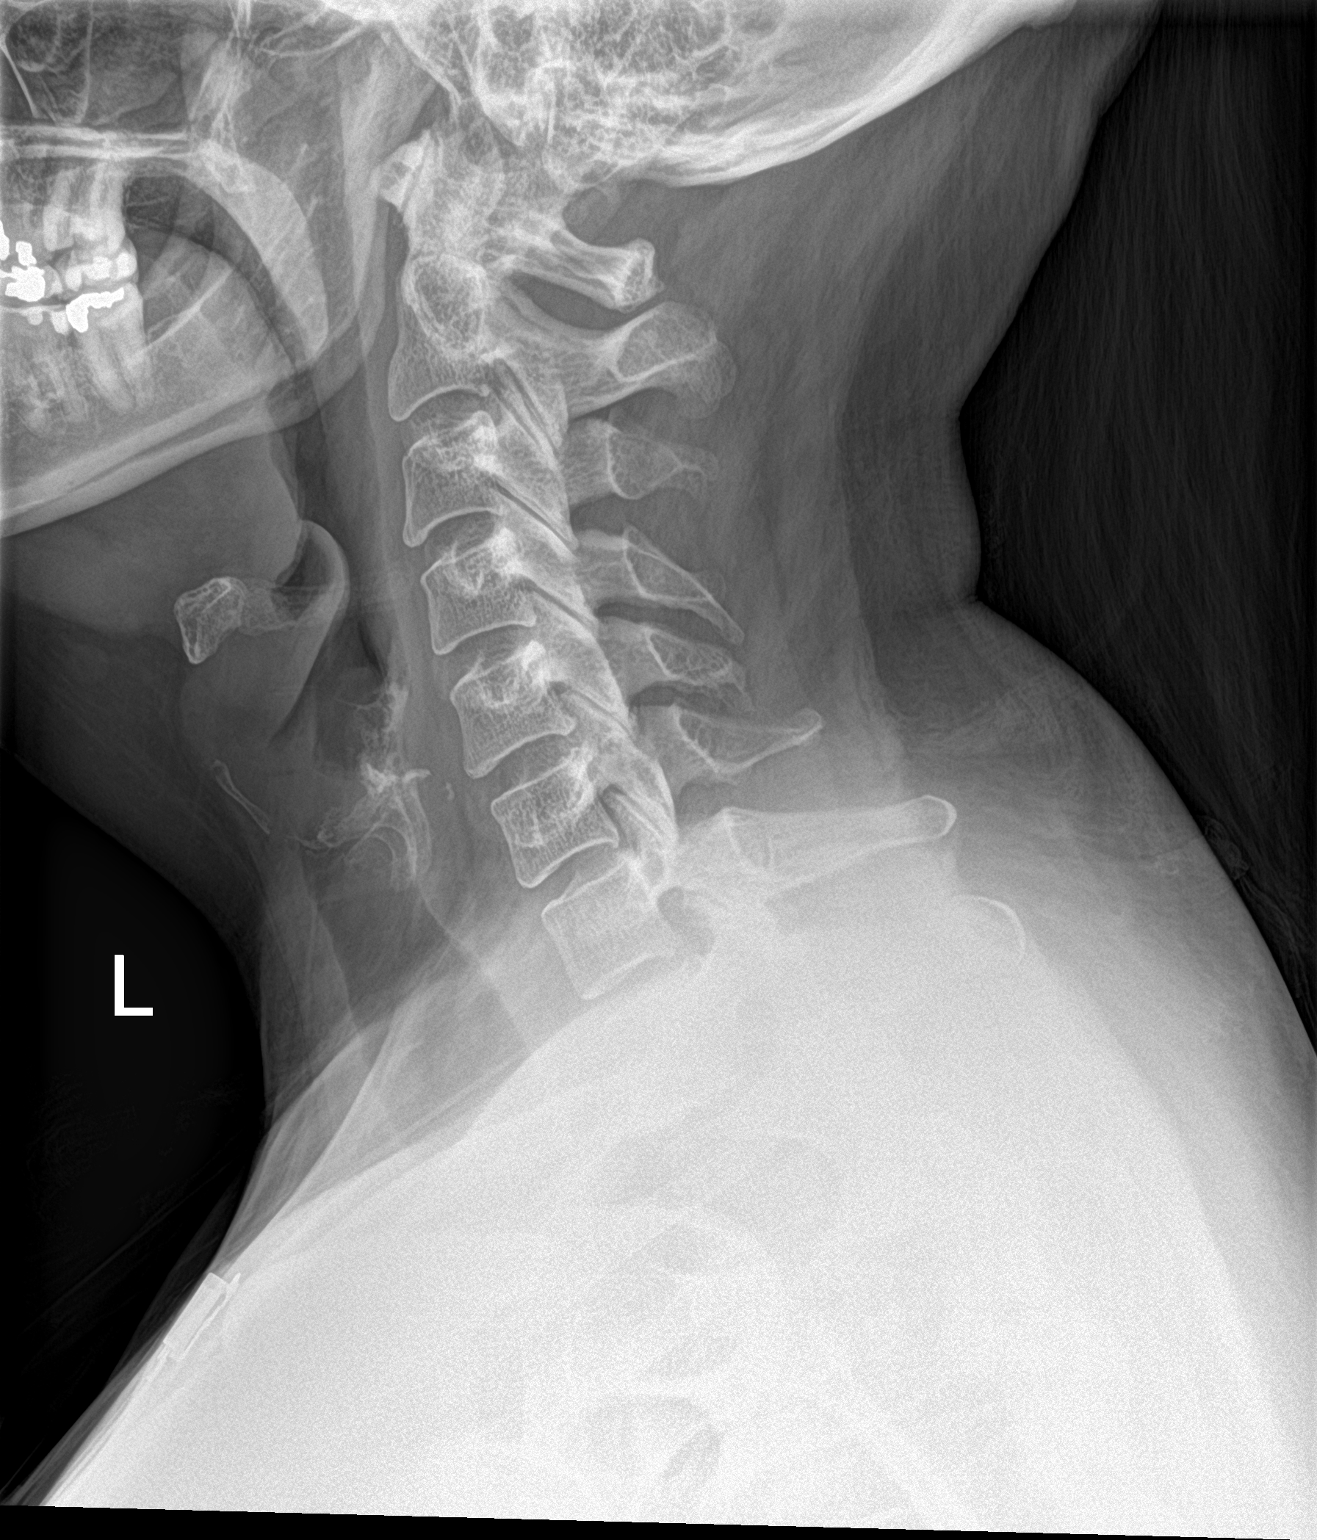

[neck ap]
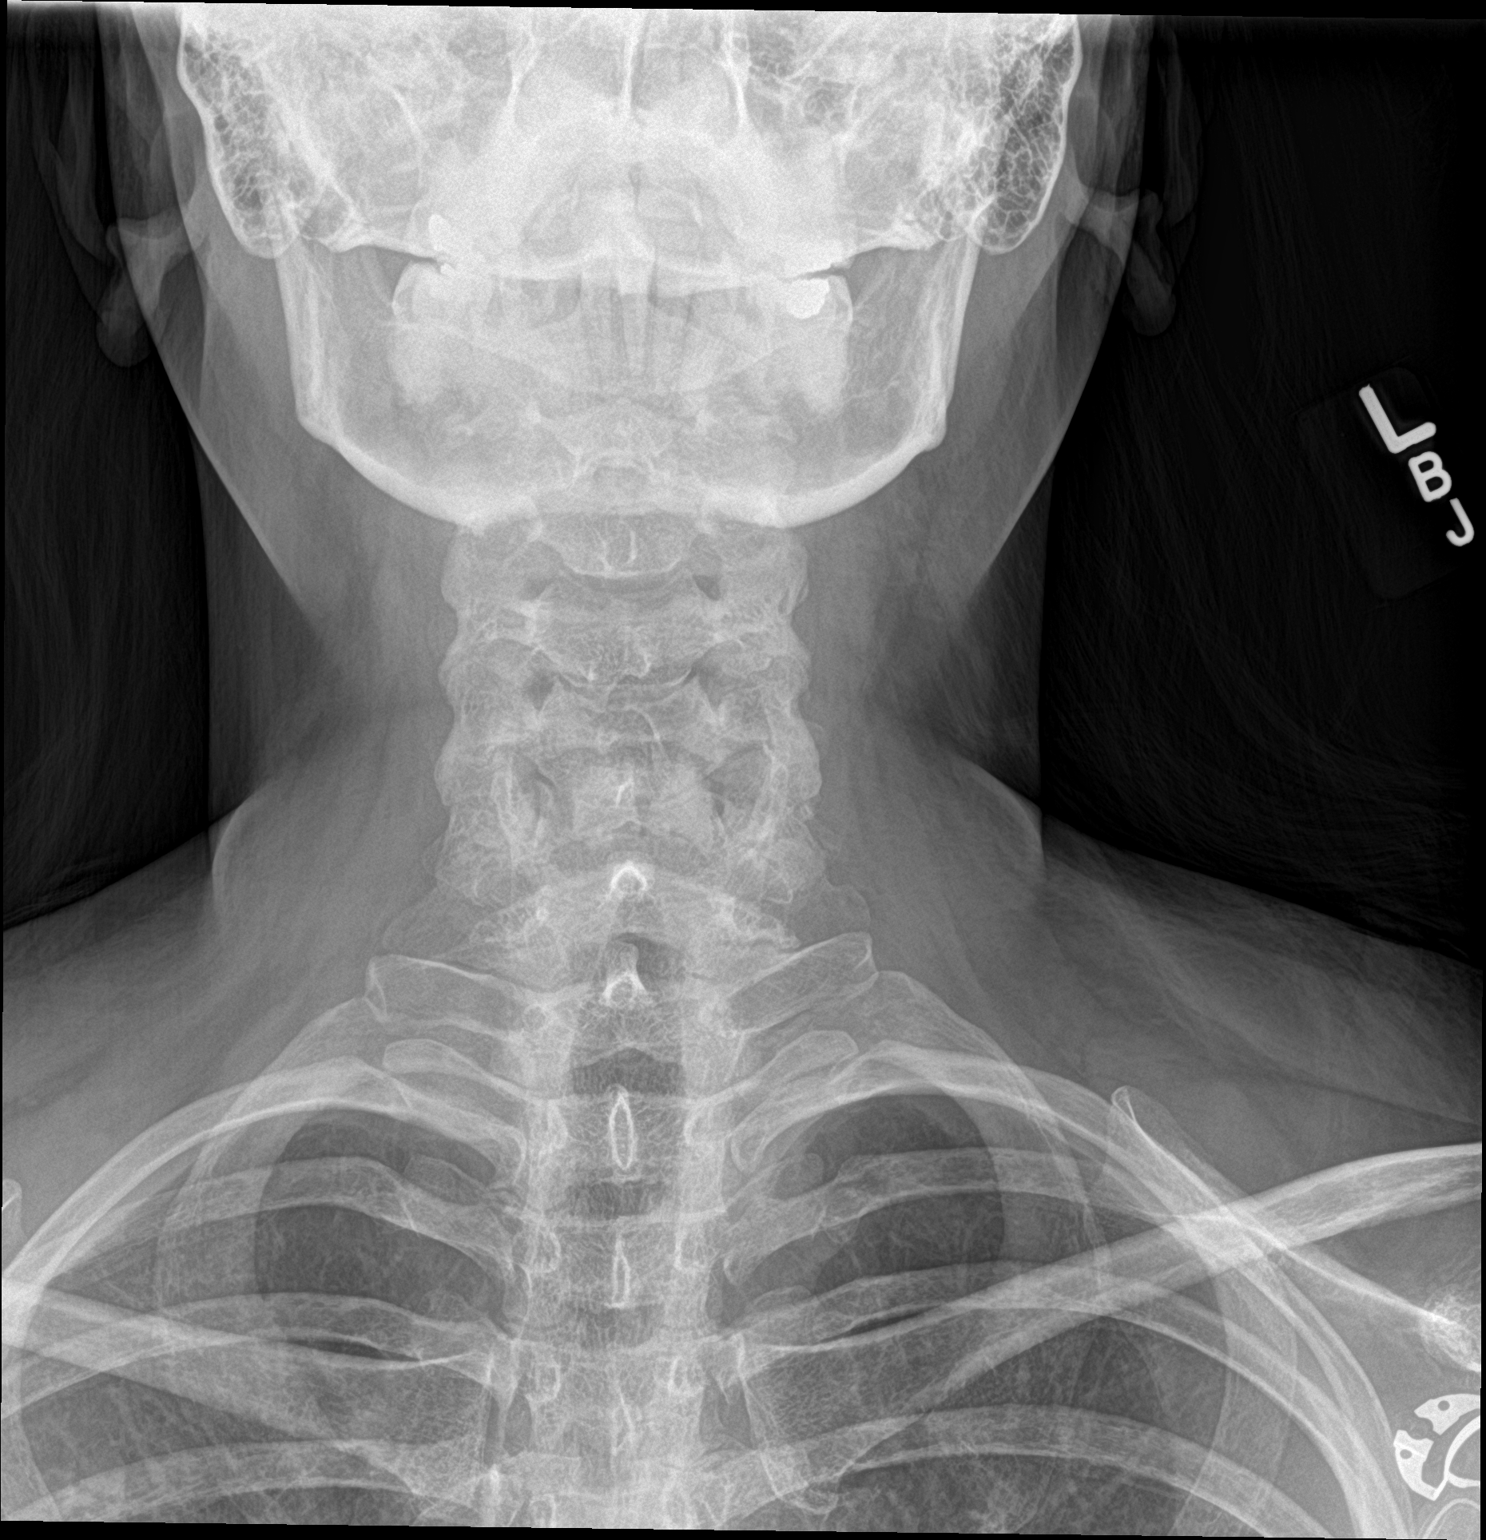

[2 of 2 positions shown; findings below may reference images not displayed]

FINDINGS: Relatively preserved cervical lordosis. Normal prevertebral soft
tissue contour. Contour of the epiglottis and pharynx appears within
normal limits. Tracheal air column within normal limits. Negative
visible lung apices. Visible osseous structures appear normal for
age.
IMPRESSION: Negative, epiglottis and pharynx contour within normal limits.

## 2021-10-27 IMAGING — DX DG CHEST 1V
1 series · 1 of 1 positions shown · non-contrast
Comparison: None.

CLINICAL DATA: Pt states she woke up this morning with shortness of
breath and difficulty breathing. Stating that "My throat feels like
it is closing up". Pt states she began feeling sick yesterday.

EXAM:
CHEST  1 VIEW

[chest pa]
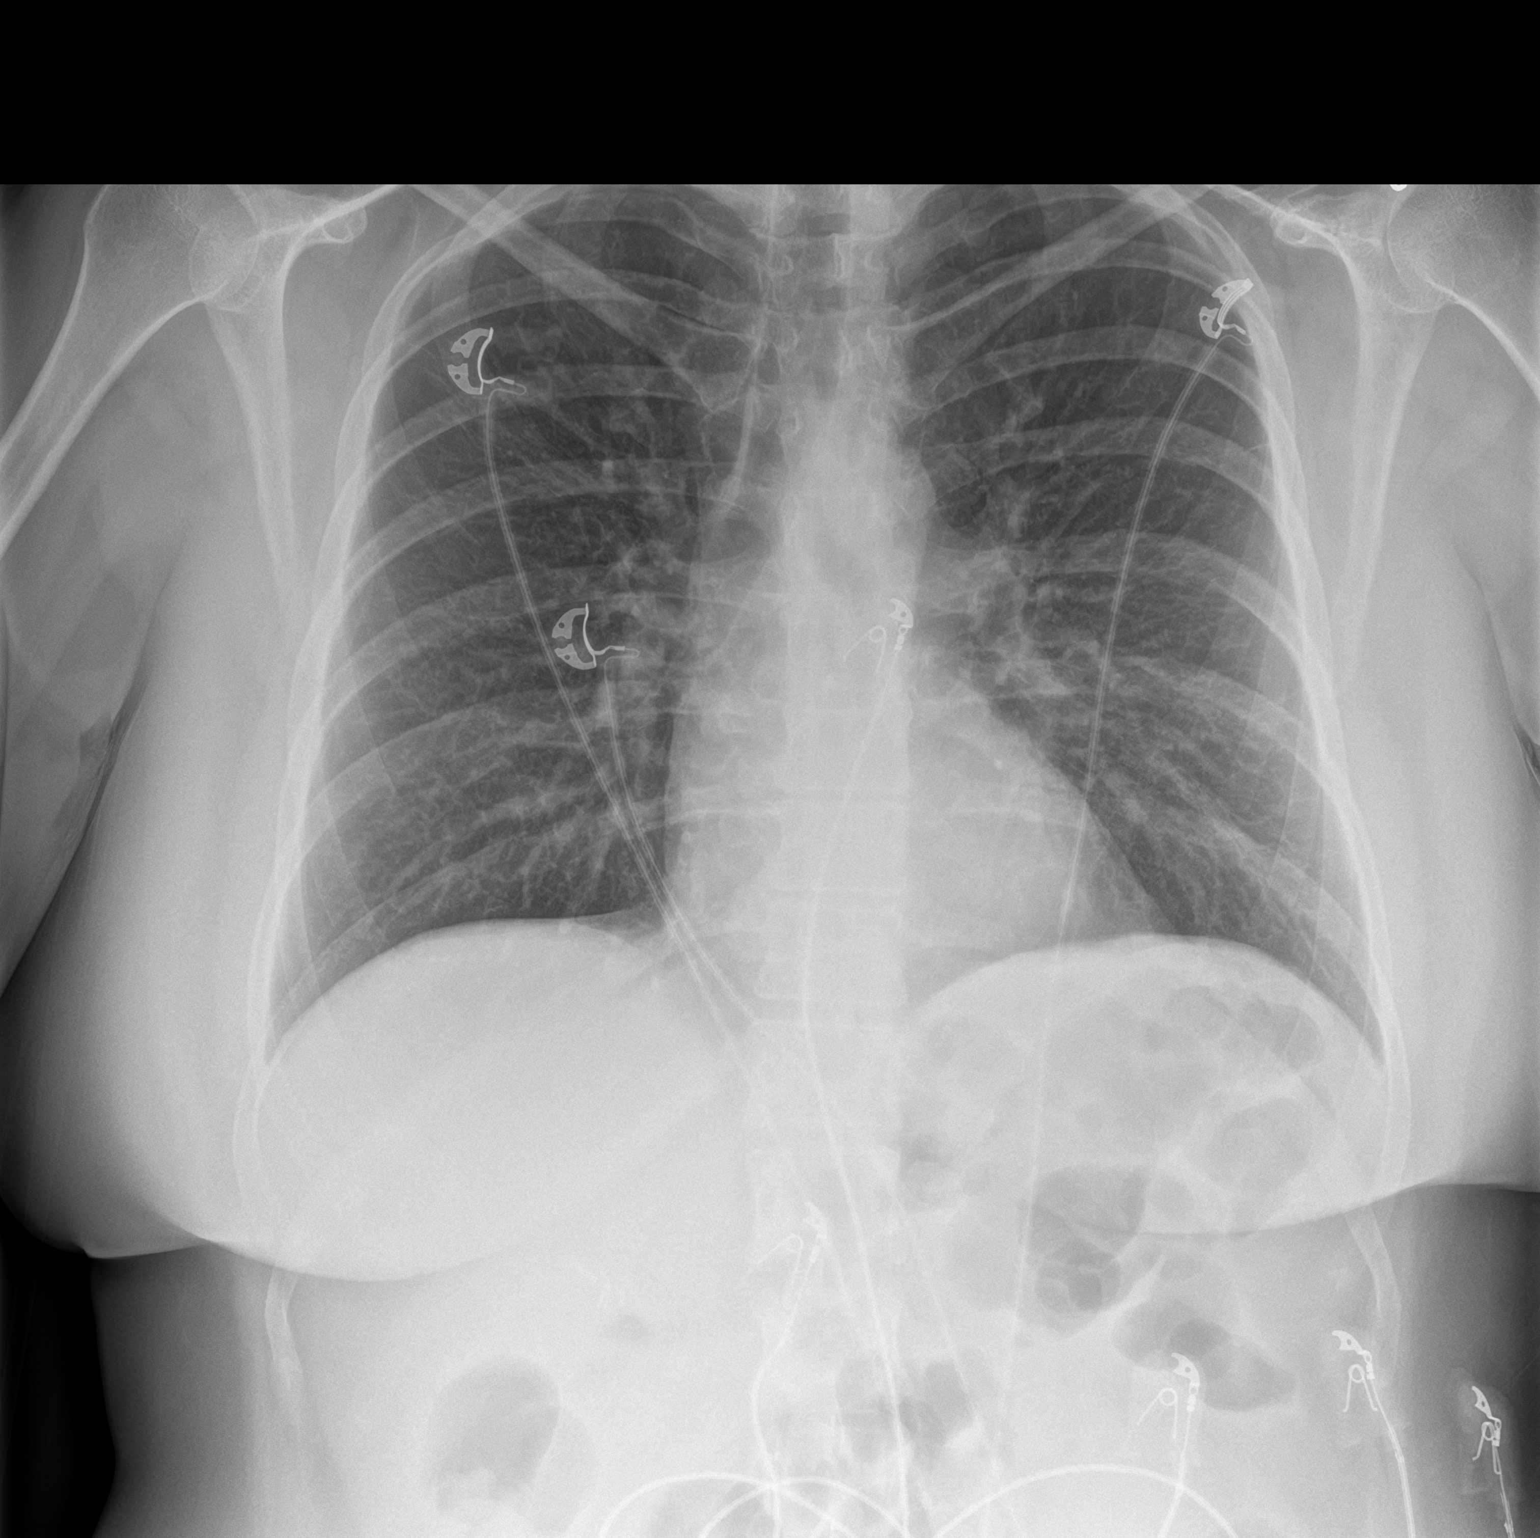

[1 of 1 positions shown; findings below may reference images not displayed]

FINDINGS: Normal mediastinum and cardiac silhouette. Normal pulmonary
vasculature. No evidence of effusion, infiltrate, or pneumothorax.
No acute bony abnormality.
IMPRESSION: No acute cardiopulmonary process.

## 2021-10-29 DIAGNOSIS — U071 COVID-19: Secondary | ICD-10-CM | POA: Diagnosis not present

## 2021-10-29 DIAGNOSIS — J4540 Moderate persistent asthma, uncomplicated: Secondary | ICD-10-CM | POA: Diagnosis not present

## 2021-11-22 DIAGNOSIS — J4540 Moderate persistent asthma, uncomplicated: Secondary | ICD-10-CM | POA: Diagnosis not present

## 2021-11-22 DIAGNOSIS — U071 COVID-19: Secondary | ICD-10-CM | POA: Diagnosis not present

## 2021-11-29 DIAGNOSIS — Z881 Allergy status to other antibiotic agents status: Secondary | ICD-10-CM | POA: Diagnosis not present

## 2021-11-29 DIAGNOSIS — Z880 Allergy status to penicillin: Secondary | ICD-10-CM | POA: Diagnosis not present

## 2021-11-29 DIAGNOSIS — Z7983 Long term (current) use of bisphosphonates: Secondary | ICD-10-CM | POA: Diagnosis not present

## 2021-11-29 DIAGNOSIS — Z885 Allergy status to narcotic agent status: Secondary | ICD-10-CM | POA: Diagnosis not present

## 2021-11-29 DIAGNOSIS — Z7961 Long term (current) use of immunomodulator: Secondary | ICD-10-CM | POA: Diagnosis not present

## 2021-11-29 DIAGNOSIS — I10 Essential (primary) hypertension: Secondary | ICD-10-CM | POA: Diagnosis not present

## 2021-11-29 DIAGNOSIS — G588 Other specified mononeuropathies: Secondary | ICD-10-CM | POA: Diagnosis not present

## 2021-11-29 DIAGNOSIS — M25562 Pain in left knee: Secondary | ICD-10-CM | POA: Diagnosis not present

## 2021-11-29 DIAGNOSIS — G43909 Migraine, unspecified, not intractable, without status migrainosus: Secondary | ICD-10-CM | POA: Diagnosis not present

## 2021-11-29 DIAGNOSIS — J4540 Moderate persistent asthma, uncomplicated: Secondary | ICD-10-CM | POA: Diagnosis not present

## 2021-11-29 DIAGNOSIS — U071 COVID-19: Secondary | ICD-10-CM | POA: Diagnosis not present

## 2021-11-29 DIAGNOSIS — J4550 Severe persistent asthma, uncomplicated: Secondary | ICD-10-CM | POA: Diagnosis not present

## 2021-11-29 DIAGNOSIS — K219 Gastro-esophageal reflux disease without esophagitis: Secondary | ICD-10-CM | POA: Diagnosis not present

## 2021-11-29 DIAGNOSIS — Z79899 Other long term (current) drug therapy: Secondary | ICD-10-CM | POA: Diagnosis not present

## 2021-11-29 DIAGNOSIS — G8929 Other chronic pain: Secondary | ICD-10-CM | POA: Diagnosis not present

## 2021-11-29 DIAGNOSIS — M1712 Unilateral primary osteoarthritis, left knee: Secondary | ICD-10-CM | POA: Diagnosis not present

## 2021-12-23 DIAGNOSIS — U071 COVID-19: Secondary | ICD-10-CM | POA: Diagnosis not present

## 2021-12-23 DIAGNOSIS — J4540 Moderate persistent asthma, uncomplicated: Secondary | ICD-10-CM | POA: Diagnosis not present

## 2021-12-30 DIAGNOSIS — I10 Essential (primary) hypertension: Secondary | ICD-10-CM | POA: Diagnosis not present

## 2021-12-30 DIAGNOSIS — J4540 Moderate persistent asthma, uncomplicated: Secondary | ICD-10-CM | POA: Diagnosis not present

## 2021-12-30 DIAGNOSIS — E7849 Other hyperlipidemia: Secondary | ICD-10-CM | POA: Diagnosis not present

## 2021-12-30 DIAGNOSIS — E1169 Type 2 diabetes mellitus with other specified complication: Secondary | ICD-10-CM | POA: Diagnosis not present

## 2022-01-14 DIAGNOSIS — J329 Chronic sinusitis, unspecified: Secondary | ICD-10-CM | POA: Diagnosis not present

## 2022-01-22 ENCOUNTER — Other Ambulatory Visit: Payer: Self-pay

## 2022-01-22 ENCOUNTER — Emergency Department (HOSPITAL_COMMUNITY)
Admission: EM | Admit: 2022-01-22 | Discharge: 2022-01-22 | Disposition: A | Discharge status: Home / Self Care | Payer: BC Managed Care – PPO | Attending: Emergency Medicine | Admitting: Emergency Medicine

## 2022-01-22 ENCOUNTER — Emergency Department (HOSPITAL_COMMUNITY): Payer: BC Managed Care – PPO

## 2022-01-22 ENCOUNTER — Encounter (HOSPITAL_COMMUNITY): Payer: Self-pay | Admitting: Emergency Medicine

## 2022-01-22 DIAGNOSIS — I10 Essential (primary) hypertension: Secondary | ICD-10-CM | POA: Diagnosis not present

## 2022-01-22 DIAGNOSIS — G43801 Other migraine, not intractable, with status migrainosus: Secondary | ICD-10-CM

## 2022-01-22 DIAGNOSIS — Z1152 Encounter for screening for COVID-19: Secondary | ICD-10-CM | POA: Diagnosis not present

## 2022-01-22 DIAGNOSIS — R519 Headache, unspecified: Secondary | ICD-10-CM | POA: Diagnosis not present

## 2022-01-22 DIAGNOSIS — G43909 Migraine, unspecified, not intractable, without status migrainosus: Secondary | ICD-10-CM | POA: Insufficient documentation

## 2022-01-22 DIAGNOSIS — U071 COVID-19: Secondary | ICD-10-CM | POA: Diagnosis not present

## 2022-01-22 DIAGNOSIS — J45909 Unspecified asthma, uncomplicated: Secondary | ICD-10-CM | POA: Diagnosis not present

## 2022-01-22 HISTORY — DX: Migraine, unspecified, not intractable, without status migrainosus: G43.909

## 2022-01-22 LAB — RESP PANEL BY RT-PCR (RSV, FLU A&B, COVID)  RVPGX2
Influenza A by PCR: NEGATIVE
Influenza B by PCR: NEGATIVE
Resp Syncytial Virus by PCR: NEGATIVE
SARS Coronavirus 2 by RT PCR: NEGATIVE

## 2022-01-22 MED ORDER — KETOROLAC TROMETHAMINE 15 MG/ML IJ SOLN
15.0000 mg | Freq: Once | INTRAMUSCULAR | Status: AC
  Administered 2022-01-22: 15 mg via INTRAVENOUS
  Filled 2022-01-22: qty 1

## 2022-01-22 MED ORDER — DIPHENHYDRAMINE HCL 25 MG PO CAPS
25.0000 mg | ORAL_CAPSULE | Freq: Once | ORAL | Status: AC
  Administered 2022-01-22: 25 mg via ORAL
  Filled 2022-01-22: qty 1

## 2022-01-22 MED ORDER — ONDANSETRON 4 MG PO TBDP: 4.0000 mg | ORAL_TABLET | Freq: Three times a day (TID) | ORAL | 0 refills | Status: DC | PRN

## 2022-01-22 MED ORDER — DEXAMETHASONE SODIUM PHOSPHATE 10 MG/ML IJ SOLN
10.0000 mg | Freq: Once | INTRAMUSCULAR | Status: AC
  Administered 2022-01-22: 10 mg via INTRAVENOUS
  Filled 2022-01-22: qty 1

## 2022-01-22 MED ORDER — LACTATED RINGERS IV BOLUS
1000.0000 mL | Freq: Once | INTRAVENOUS | Status: AC
  Administered 2022-01-22: 1000 mL via INTRAVENOUS

## 2022-01-22 MED ORDER — ACETAMINOPHEN 500 MG PO TABS
1000.0000 mg | ORAL_TABLET | Freq: Once | ORAL | Status: AC
  Administered 2022-01-22: 1000 mg via ORAL
  Filled 2022-01-22: qty 2

## 2022-01-22 MED ORDER — PROCHLORPERAZINE EDISYLATE 10 MG/2ML IJ SOLN
10.0000 mg | Freq: Once | INTRAMUSCULAR | Status: AC
  Administered 2022-01-22: 10 mg via INTRAVENOUS
  Filled 2022-01-22: qty 2

## 2022-01-22 MED ORDER — MAGNESIUM SULFATE 2 GM/50ML IV SOLN
2.0000 g | Freq: Once | INTRAVENOUS | Status: AC
  Administered 2022-01-22: 2 g via INTRAVENOUS
  Filled 2022-01-22: qty 50

## 2022-01-22 NOTE — Discharge Instructions (Addendum)
Thank you for coming to [facility] Emergency Department. You were seen for chest pain, leg swelling. We did an exam, labs, and imaging, and these showed no abnormalities. You were given a headache cocktail and your symptoms improved. We have prescribed zofran under the tongue tablets to take every 6-8 hours as needed for nausea. Stay well hydrated at home. Please follow up with your primary care provider within 1 week.   Do not hesitate to return to the ED or call 911 if you experience: -Worsening symptoms -Thunderclap headache -Stroke-like symptoms such as weakness on one side or another, numbness/tingling, trouble speaking or swallowing, confusion, facial droop -Nausea/vomiting so severe you cannot eat or drink anything -Lightheadedness, passing out -Fevers/chills -Anything else that concerns you

## 2022-01-22 NOTE — ED Triage Notes (Signed)
Pt complains of migraine headache x 4 days. Pt states light sensitivity, elevated BP, nausea and vomiting.

## 2022-01-22 NOTE — ED Provider Notes (Incomplete)
[facility] EMERGENCY DEPARTMENT Provider Note   CSN: [id number] Arrival date & time: 01/22/22  1733     History {Add pertinent medical, surgical, social history, OB history to HPI:1} Chief Complaint  Patient presents with   Migraine    Jamie Doyle is a 46 y.o. female with asthma, HTN, OA, migraines, GERD, who presents with migraine.  Patient reports gradually worsening migraine headache 10/10 x 4 days. Constant. Started in bilateral temple area and now hurts all over the head. Endorsing light sensitivity, elevated BP to 151 systolic today, and nausea and vomiting.  Has had migraines before but never this severe. Endorses some dizziness, neck pain, and bilateral blurry vision as well. Denies any fevers/chills, numbness/tingling, asymmetric weakness, falls, trauma, double vision, chest/abdominal pain, SOB, cough, recent illnesses. Tried imitrex yesterday and it did not help. Normally does help. Also tried ibuprofen, tylenol, caffeine without success.    Migraine       Home Medications Prior to Admission medications   Medication Sig Start Date End Date Taking? Authorizing Provider  Alendronate Sodium (FOSAMAX PO) Take by mouth.    [provider]  Montelukast Sodium (SINGULAIR PO) Take by mouth.    [provider]  oxyCODONE HCl (OXYCONTIN PO) Take by mouth.    [provider]  Pantoprazole Sodium (PROTONIX PO) Take by mouth.    [provider]  predniSONE (DELTASONE) 20 MG tablet 2 tabs po daily x 3 days 08/08/20   Bethann Berkshire, MD      Allergies    Biaxin [clarithromycin], Morphine and related, and Penicillins    Review of Systems   Review of Systems Review of systems Negative for f/c.  A 10 point review of systems was performed and is negative unless otherwise reported in HPI.  Physical Exam Updated Vital Signs BP (!) 145/89   Pulse 88   Temp 98.2 F (36.8 C) (Oral)   Resp 20   Ht 5\' 8"  (1.727 m)   Wt 113.4 kg   SpO2 99%    BMI 38.01 kg/m  Physical Exam General: Normal appearing female, lying in bed.  HEENT: PERRLA, EOMI, Sclera anicteric, MMM, trachea midline. 20/40 visual acuity bilaterally. Quiet conjunctivae. No nystagmus. No pain with EOM.  Cardiology: RRR, no murmurs/rubs/gallops. BL radial and DP pulses equal bilaterally.  Resp: Normal respiratory rate and effort. CTAB, no wheezes, rhonchi, crackles.  Abd: Soft, non-tender, non-distended. No rebound tenderness or guarding.  GU: Deferred. MSK: No peripheral edema or signs of trauma. Extremities without deformity or TTP. No cyanosis or clubbing. Skin: warm, dry. No rashes or lesions. Neuro: A&Ox4, CNs II-XII grossly intact. 5/5 strength in extremities. Sensation grossly intact. Normal coordination, normal speech, normal gait.  Psych: Normal mood and affect.   ED Results / Procedures / Treatments   Labs (all labs ordered are listed, but only abnormal results are displayed) Labs Reviewed  RESP PANEL BY RT-PCR (RSV, FLU A&B, COVID)  RVPGX2  POC URINE PREG, ED    EKG None  Radiology No results found.  Procedures Procedures  {Document cardiac monitor, telemetry assessment procedure when appropriate:1}  Medications Ordered in ED Medications  acetaminophen (TYLENOL) tablet 1,000 mg (has no administration in time range)  prochlorperazine (COMPAZINE) injection 10 mg (has no administration in time range)  diphenhydrAMINE (BENADRYL) capsule 25 mg (has no administration in time range)  ketorolac (TORADOL) 15 MG/ML injection 15 mg (has no administration in time range)  magnesium sulfate IVPB 2 g 50 mL (has no administration in time range)  ED Course/ Medical Decision Making/ A&P                          Medical Decision Making Amount and/or Complexity of Data Reviewed Labs:  Decision-making details documented in ED Course. Radiology: ordered.  Risk OTC drugs. Prescription drug management.    This patient presents to the ED for concern of  headache worsening, this involves an extensive number of treatment options, and is a complaint that carries with it a high risk of complications and morbidity.  I considered the following differential and admission for this acute, potentially life threatening condition.   MDM:    For headache, consider migraine especially w/ h/o migraines a/w light sensitivity, N/V. Tension headache given report of neck pain and pain in temples bilaterally. Given worse than normal sympsom and not going away as it normally would will obtain [facility] to assess for hydrocephalus, ICH.  Patient also endorsing blurry vision, must consider IIH. Will give headache cocktail and obtain CTH and reassess.   Clinical Course as of 01/22/22 2113  Sat Jan 22, 2022  2029 Resp panel by RT-PCR (RSV, Flu A&B, Covid) Anterior Nasal Swab Neg [HN]    Clinical Course User Index [HN] Loetta Rough, MD    Labs: I Ordered, and personally interpreted labs.  The pertinent results include:  neg resp panel  Imaging Studies ordered: I ordered imaging studies including CTH I independently visualized and interpreted imaging. I agree with the radiologist interpretation  Additional history obtained from husband at bedside.    Cardiac Monitoring: The patient was maintained on a cardiac monitor.  I personally viewed and interpreted the cardiac monitored which showed an underlying rhythm of: ***  Reevaluation: After the interventions noted above, I reevaluated the patient and found that they have :{resolved/improved/worsened:23923::"improved"}  Social Determinants of Health: ***  Disposition:  ***  Co morbidities that complicate the patient evaluation  Past Medical History:  Diagnosis Date   Asthma    Hypertension    Migraine      Medicines Meds ordered this encounter  Medications   acetaminophen (TYLENOL) tablet 1,000 mg   prochlorperazine (COMPAZINE) injection 10 mg   diphenhydrAMINE (BENADRYL) capsule 25 mg   ketorolac  (TORADOL) 15 MG/ML injection 15 mg   magnesium sulfate IVPB 2 g 50 mL    I have reviewed the patients home medicines and have made adjustments as needed  Problem List / ED Course: Problem List Items Addressed This Visit   None        {Document critical care time when appropriate:1} {Document review of labs and clinical decision tools ie heart score, Chads2Vasc2 etc:1}  {Document your independent review of radiology images, and any outside records:1} {Document your discussion with family members, caretakers, and with consultants:1} {Document social determinants of health affecting pt's care:1} {Document your decision making why or why not admission, treatments were needed:1}  This note was created using dictation software, which may contain spelling or grammatical errors.

## 2022-01-26 DIAGNOSIS — Z6836 Body mass index (BMI) 36.0-36.9, adult: Secondary | ICD-10-CM | POA: Diagnosis not present

## 2022-01-26 DIAGNOSIS — I10 Essential (primary) hypertension: Secondary | ICD-10-CM | POA: Diagnosis not present

## 2022-01-29 DIAGNOSIS — U071 COVID-19: Secondary | ICD-10-CM | POA: Diagnosis not present

## 2022-01-29 DIAGNOSIS — J4540 Moderate persistent asthma, uncomplicated: Secondary | ICD-10-CM | POA: Diagnosis not present

## 2022-02-22 DIAGNOSIS — U071 COVID-19: Secondary | ICD-10-CM | POA: Diagnosis not present

## 2022-02-22 DIAGNOSIS — J4540 Moderate persistent asthma, uncomplicated: Secondary | ICD-10-CM | POA: Diagnosis not present

## 2022-02-24 DIAGNOSIS — J329 Chronic sinusitis, unspecified: Secondary | ICD-10-CM | POA: Diagnosis not present

## 2022-02-24 DIAGNOSIS — G4452 New daily persistent headache (NDPH): Secondary | ICD-10-CM | POA: Diagnosis not present

## 2022-02-24 DIAGNOSIS — R0981 Nasal congestion: Secondary | ICD-10-CM | POA: Diagnosis not present

## 2022-02-24 DIAGNOSIS — R059 Cough, unspecified: Secondary | ICD-10-CM | POA: Diagnosis not present

## 2022-03-22 DIAGNOSIS — R6889 Other general symptoms and signs: Secondary | ICD-10-CM | POA: Diagnosis not present

## 2022-03-24 DIAGNOSIS — J4540 Moderate persistent asthma, uncomplicated: Secondary | ICD-10-CM | POA: Diagnosis not present

## 2022-03-24 DIAGNOSIS — U071 COVID-19: Secondary | ICD-10-CM | POA: Diagnosis not present

## 2022-03-25 DIAGNOSIS — J329 Chronic sinusitis, unspecified: Secondary | ICD-10-CM | POA: Diagnosis not present

## 2022-03-25 DIAGNOSIS — B9689 Other specified bacterial agents as the cause of diseases classified elsewhere: Secondary | ICD-10-CM | POA: Diagnosis not present

## 2022-04-23 DIAGNOSIS — J4540 Moderate persistent asthma, uncomplicated: Secondary | ICD-10-CM | POA: Diagnosis not present

## 2022-04-23 DIAGNOSIS — U071 COVID-19: Secondary | ICD-10-CM | POA: Diagnosis not present

## 2022-05-03 DIAGNOSIS — F411 Generalized anxiety disorder: Secondary | ICD-10-CM | POA: Diagnosis not present

## 2022-05-03 DIAGNOSIS — E7849 Other hyperlipidemia: Secondary | ICD-10-CM | POA: Diagnosis not present

## 2022-05-03 DIAGNOSIS — K219 Gastro-esophageal reflux disease without esophagitis: Secondary | ICD-10-CM | POA: Diagnosis not present

## 2022-05-03 DIAGNOSIS — I10 Essential (primary) hypertension: Secondary | ICD-10-CM | POA: Diagnosis not present

## 2022-05-24 DIAGNOSIS — J4540 Moderate persistent asthma, uncomplicated: Secondary | ICD-10-CM | POA: Diagnosis not present

## 2022-05-24 DIAGNOSIS — U071 COVID-19: Secondary | ICD-10-CM | POA: Diagnosis not present

## 2022-06-13 DIAGNOSIS — M79672 Pain in left foot: Secondary | ICD-10-CM | POA: Diagnosis not present

## 2022-06-13 DIAGNOSIS — M7752 Other enthesopathy of left foot: Secondary | ICD-10-CM | POA: Diagnosis not present

## 2022-06-15 ENCOUNTER — Ambulatory Visit: Payer: BC Managed Care – PPO | Admitting: Orthopedic Surgery

## 2022-06-17 ENCOUNTER — Encounter: Payer: Self-pay | Admitting: Orthopedic Surgery

## 2022-06-17 ENCOUNTER — Ambulatory Visit: Payer: BC Managed Care – PPO | Admitting: Orthopedic Surgery

## 2022-06-17 VITALS — BP 136/77 | HR 88 | Ht 68.0 in | Wt 246.0 lb

## 2022-06-17 DIAGNOSIS — M79672 Pain in left foot: Secondary | ICD-10-CM | POA: Diagnosis not present

## 2022-06-17 NOTE — Progress Notes (Signed)
New Patient Visit  Assessment: Hananiah Kaminskas is a 47 y.o. female with the following: 1. Pain in left foot  Plan: Orpah Melter has acute onset pain in the left foot.  She recently started increasing her level of activity, and working out at [employer]  She started to have pain over the lateral forefoot.  Radiographs in the urgent care center demonstrates possible stress fracture.  Otherwise, she could have sustained a soft tissue injury in the lateral forefoot.  We will continue to monitor closely.  I would like to see her back in 2 weeks with a repeat x-ray.  She should remain in the boot, bearing weight through her heel.  Elevate the foot to help with swelling.  She can use ice and medications as needed.  She will contact the clinic if she has any issues.  Follow-up: Return in about 2 weeks (around 07/01/2022).  Subjective:  Chief Complaint  Patient presents with   Foot Injury    L foot DOI 06/09/22     History of Present Illness: Cotina Sjostrom is a 47 y.o. female who presents for evaluation of left foot pain.  She has had pain in the foot for the past week or so.  She states that she started to work out recently, and was walking on a treadmill, when she started to have some pain over the lateral forefoot.  It progressively worsened.  She noted a lot of swelling initially.  Over the past week, her swelling has improved.  After a few days, she went to an urgent care center, and had x-rays.  She has been using a boot.  This is helping with her pain.  Her swelling has gotten better.  She has been taking ibuprofen regularly.  She is icing her foot when she gets a chance.  She has been trying to elevate her foot is much as possible.  No prior injuries like this in the past.  She did not roll her ankle.   Review of Systems: No fevers or chills No numbness or tingling No chest pain No shortness of breath No bowel or bladder dysfunction No GI distress No headaches   Medical History:  Past  Medical History:  Diagnosis Date   Asthma    Hypertension    Migraine     History reviewed. No pertinent surgical history.  History reviewed. No pertinent family history. Social History   Tobacco Use   Smoking status: Never   Smokeless tobacco: Never  Substance Use Topics   Alcohol use: Never    Allergies  Allergen Reactions   Biaxin [Clarithromycin]    Morphine And Codeine    Penicillins     Current Meds  Medication Sig   albuterol (VENTOLIN HFA) 108 (90 Base) MCG/ACT inhaler INHALE TWO PUFFS BY INHALATION ROUTE EVERY 4-6 HOURS AS NEEDED   buPROPion (WELLBUTRIN XL) 150 MG 24 hr tablet Take 1 tablet by mouth daily.   clonazePAM (KLONOPIN) 0.5 MG tablet TAKE 1 TABLET BY MOUTH 2 TIMES DAILY AS NEEDED   Diclofenac-miSOPROStol 75-0.2 MG TBEC Take 1 tablet by mouth 2 (two) times daily.   diltiazem (CARDIZEM CD) 180 MG 24 hr capsule Take 180 mg by mouth daily.   estradiol (ESTRACE) 0.5 MG tablet Take 0.5 mg by mouth daily.   famotidine (PEPCID) 40 MG tablet Take 40 mg by mouth daily.   fluticasone (FLONASE) 50 MCG/ACT nasal spray Place into both nostrils daily.   montelukast (SINGULAIR) 10 MG tablet 10 mg.  olmesartan (BENICAR) 20 MG tablet Take 20 mg by mouth daily.   pantoprazole (PROTONIX) 40 MG tablet Take 40 mg by mouth daily.   SYMBICORT 160-4.5 MCG/ACT inhaler Inhale into the lungs.   [DISCONTINUED] diltiazem (CARDIZEM CD) 120 MG 24 hr capsule Take 1 tablet by mouth daily.    Objective: BP 136/77   Pulse 88   Ht 5\' 8"  (1.727 m)   Wt 246 lb (111.6 kg)   BMI 37.40 kg/m   Physical Exam:  General: Alert and oriented. and No acute distress. Gait: Left sided antalgic gait.  Ambulating in a boot.  Evaluation of the left foot demonstrates mild diffuse swelling over the lateral forefoot.  No obvious bruising.  She has tenderness to palpation along the shaft of the fourth metatarsal.  Tenderness to palpation at the metatarsal heads of third fourth and fifth toes.  Toes  warm and well-perfused.  Sensation intact over the dorsum of the foot.  Active motion intact in the TA/EHL.  IMAGING: I personally reviewed images previously obtained from the ED  X-rays from the urgent care center were available in clinic today.  There is a slight cortical reaction of the distal fourth metatarsal shaft.  Otherwise, no acute bony injuries.  No dislocations.  No obvious fractures.  New Medications:  No orders of the defined types were placed in this encounter.     Oliver Barre, MD  06/17/2022 8:53 AM

## 2022-06-17 NOTE — Patient Instructions (Signed)
Continue to wear the boot on your left foot.  Elevate when able.  Medicines as needed.  Can use ice to help with swelling and pain.  Okay to bear weight through your heel.  Limit weightbearing through your toes.  Follow-up in 2 weeks for repeat x-ray.  Call with questions or concerns.

## 2022-06-23 DIAGNOSIS — U071 COVID-19: Secondary | ICD-10-CM | POA: Diagnosis not present

## 2022-06-23 DIAGNOSIS — J4540 Moderate persistent asthma, uncomplicated: Secondary | ICD-10-CM | POA: Diagnosis not present

## 2022-07-01 ENCOUNTER — Ambulatory Visit: Payer: BC Managed Care – PPO | Admitting: Orthopedic Surgery

## 2022-07-24 DIAGNOSIS — J4540 Moderate persistent asthma, uncomplicated: Secondary | ICD-10-CM | POA: Diagnosis not present

## 2022-07-24 DIAGNOSIS — U071 COVID-19: Secondary | ICD-10-CM | POA: Diagnosis not present

## 2022-08-23 DIAGNOSIS — J4540 Moderate persistent asthma, uncomplicated: Secondary | ICD-10-CM | POA: Diagnosis not present

## 2022-08-23 DIAGNOSIS — U071 COVID-19: Secondary | ICD-10-CM | POA: Diagnosis not present

## 2022-09-23 DIAGNOSIS — J4540 Moderate persistent asthma, uncomplicated: Secondary | ICD-10-CM | POA: Diagnosis not present

## 2022-09-23 DIAGNOSIS — U071 COVID-19: Secondary | ICD-10-CM | POA: Diagnosis not present

## 2022-10-03 DIAGNOSIS — Z0000 Encounter for general adult medical examination without abnormal findings: Secondary | ICD-10-CM | POA: Diagnosis not present

## 2022-10-03 DIAGNOSIS — F330 Major depressive disorder, recurrent, mild: Secondary | ICD-10-CM | POA: Diagnosis not present

## 2022-10-03 DIAGNOSIS — F411 Generalized anxiety disorder: Secondary | ICD-10-CM | POA: Diagnosis not present

## 2022-10-03 DIAGNOSIS — E7849 Other hyperlipidemia: Secondary | ICD-10-CM | POA: Diagnosis not present

## 2022-10-03 DIAGNOSIS — Z6836 Body mass index (BMI) 36.0-36.9, adult: Secondary | ICD-10-CM | POA: Diagnosis not present

## 2022-10-03 DIAGNOSIS — I10 Essential (primary) hypertension: Secondary | ICD-10-CM | POA: Diagnosis not present

## 2022-10-03 DIAGNOSIS — N182 Chronic kidney disease, stage 2 (mild): Secondary | ICD-10-CM | POA: Diagnosis not present

## 2022-10-23 DIAGNOSIS — J4521 Mild intermittent asthma with (acute) exacerbation: Secondary | ICD-10-CM | POA: Diagnosis not present

## 2022-10-24 DIAGNOSIS — U071 COVID-19: Secondary | ICD-10-CM | POA: Diagnosis not present

## 2022-10-24 DIAGNOSIS — J4540 Moderate persistent asthma, uncomplicated: Secondary | ICD-10-CM | POA: Diagnosis not present

## 2022-11-23 DIAGNOSIS — U071 COVID-19: Secondary | ICD-10-CM | POA: Diagnosis not present

## 2022-11-23 DIAGNOSIS — J4540 Moderate persistent asthma, uncomplicated: Secondary | ICD-10-CM | POA: Diagnosis not present

## 2022-12-24 DIAGNOSIS — J4540 Moderate persistent asthma, uncomplicated: Secondary | ICD-10-CM | POA: Diagnosis not present

## 2022-12-24 DIAGNOSIS — U071 COVID-19: Secondary | ICD-10-CM | POA: Diagnosis not present

## 2023-02-13 DIAGNOSIS — S39012A Strain of muscle, fascia and tendon of lower back, initial encounter: Secondary | ICD-10-CM | POA: Diagnosis not present

## 2023-02-23 DIAGNOSIS — U071 COVID-19: Secondary | ICD-10-CM | POA: Diagnosis not present

## 2023-02-23 DIAGNOSIS — J4540 Moderate persistent asthma, uncomplicated: Secondary | ICD-10-CM | POA: Diagnosis not present

## 2023-03-21 DIAGNOSIS — I872 Venous insufficiency (chronic) (peripheral): Secondary | ICD-10-CM | POA: Diagnosis not present

## 2023-03-21 DIAGNOSIS — N182 Chronic kidney disease, stage 2 (mild): Secondary | ICD-10-CM | POA: Diagnosis not present

## 2023-03-21 DIAGNOSIS — E7849 Other hyperlipidemia: Secondary | ICD-10-CM | POA: Diagnosis not present

## 2023-03-21 DIAGNOSIS — I10 Essential (primary) hypertension: Secondary | ICD-10-CM | POA: Diagnosis not present

## 2023-03-24 DIAGNOSIS — J4540 Moderate persistent asthma, uncomplicated: Secondary | ICD-10-CM | POA: Diagnosis not present

## 2023-03-24 DIAGNOSIS — U071 COVID-19: Secondary | ICD-10-CM | POA: Diagnosis not present

## 2023-04-18 DIAGNOSIS — I10 Essential (primary) hypertension: Secondary | ICD-10-CM | POA: Diagnosis not present

## 2023-04-18 DIAGNOSIS — R0602 Shortness of breath: Secondary | ICD-10-CM | POA: Diagnosis not present

## 2023-04-18 DIAGNOSIS — Z881 Allergy status to other antibiotic agents status: Secondary | ICD-10-CM | POA: Diagnosis not present

## 2023-04-18 DIAGNOSIS — E669 Obesity, unspecified: Secondary | ICD-10-CM | POA: Diagnosis not present

## 2023-04-18 DIAGNOSIS — F32A Depression, unspecified: Secondary | ICD-10-CM | POA: Diagnosis not present

## 2023-04-18 DIAGNOSIS — Z880 Allergy status to penicillin: Secondary | ICD-10-CM | POA: Diagnosis not present

## 2023-04-18 DIAGNOSIS — Z885 Allergy status to narcotic agent status: Secondary | ICD-10-CM | POA: Diagnosis not present

## 2023-04-18 DIAGNOSIS — Z7951 Long term (current) use of inhaled steroids: Secondary | ICD-10-CM | POA: Diagnosis not present

## 2023-04-18 DIAGNOSIS — F419 Anxiety disorder, unspecified: Secondary | ICD-10-CM | POA: Diagnosis not present

## 2023-04-18 DIAGNOSIS — J45909 Unspecified asthma, uncomplicated: Secondary | ICD-10-CM | POA: Diagnosis not present

## 2023-04-18 DIAGNOSIS — K219 Gastro-esophageal reflux disease without esophagitis: Secondary | ICD-10-CM | POA: Diagnosis not present

## 2023-04-18 DIAGNOSIS — R0789 Other chest pain: Secondary | ICD-10-CM | POA: Diagnosis not present

## 2023-04-18 DIAGNOSIS — Z6837 Body mass index (BMI) 37.0-37.9, adult: Secondary | ICD-10-CM | POA: Diagnosis not present

## 2023-04-18 DIAGNOSIS — R059 Cough, unspecified: Secondary | ICD-10-CM | POA: Diagnosis not present

## 2023-04-18 NOTE — ED Provider Notes (Signed)
 Emergency Department Provider Note    ED Clinical Impression   Final diagnoses:  Asthma, unspecified asthma severity, unspecified whether complicated, unspecified whether persistent (Primary)    ED Assessment/Plan    Condition: Stable Disposition: Discharge  This chart has been completed using Dragon Medical Dictation software, and while attempts have been made to ensure accuracy, certain words and phrases may not be transcribed as intended.   History   Chief Complaint  Patient presents with  . Shortness of Breath  . Cough   HPI  Jamie Doyle is a 48 y.o. female  who presents today to the  emergency department complaining of Pt reports having shortness of breath and coughing that started yesterday. Pt reports a history of asthma. Pt reports she has been taking steroids and nebs since last week.  Pt appears anxious.    Allergies: is allergic to clarithromycin, morphine, nalbuphine, penicillin g, and penicillins. Medications: has a current medication list which includes the following long-term medication(s): albuterol , albuterol , clonazepam, diltiazem, famotidine, fluticasone propionate, fluticasone propionate, montelukast, and symbicort. PMHx:  has a past medical history of Anxiety (01/24/2001), Asthma, Depression, GERD (gastroesophageal reflux disease), Heart murmur, Hypertension, IUD (intrauterine device) in place, Migraines, and Osteoarthritis. PSHx:  has a past surgical history that includes Tubal ligation; Cholecystectomy; and knee sx (Right, 09/2019). SocHx:  reports that she has never smoked. She has never used smokeless tobacco. She reports that she does not currently use alcohol. She reports that she does not use drugs. Allergies, Medications, Medical, Surgical, and Social History were reviewed as documented above.   Social Drivers of Health with Concerns   Physical  Activity: Unknown (02/27/2022)   Received from [facility]   Exercise Vital Sign   . Days of Exercise per Week: 0 days   . Minutes of Exercise per Session: Not on file  Recent Concern: Physical Activity - Inactive (02/27/2022)   Received from [facility]   Exercise Vital Sign   . Days of Exercise per Week: 0 days   . Minutes of Exercise per Session: 20 min  Interpersonal Safety: Not on file  Substance Use: Not on file (11/29/2022)  Internet Connectivity: Not on file     Review Of Systems  Review of Systems  Constitutional:  Negative for chills and fever.  Respiratory:  Positive for cough, chest tightness, shortness of breath and wheezing.   Cardiovascular:  Negative for chest pain.  Gastrointestinal:  Negative for abdominal pain, diarrhea, nausea and vomiting.  Genitourinary:  Negative for dysuria and frequency.  Musculoskeletal:  Negative for myalgias and neck pain.  Skin:  Negative for rash and wound.  Neurological:  Negative for dizziness, weakness and headaches.    Physical Exam   BP 139/88   Pulse 110   Temp 36.8 C (98.2 F) (Oral)   Resp 18   Wt (!) 114.4 kg (252 lb 3.2 oz)   LMP 11/21/2017   SpO2 95%   BMI 37.24 kg/m   Physical Exam Constitutional:      General: She is in acute distress (anxious).     Appearance: Normal appearance. She is obese. She is not ill-appearing.  HENT:     Head: Normocephalic.  Eyes:     Conjunctiva/sclera: Conjunctivae normal.  Cardiovascular:     Rate and Rhythm: Normal rate and  regular rhythm.     Pulses: Normal pulses.     Heart sounds: Normal heart sounds.  Pulmonary:     Effort: Pulmonary effort is normal. No respiratory distress.     Breath sounds: Wheezing present.  Abdominal:     General: Abdomen is flat. Bowel sounds are normal.     Palpations: Abdomen is soft.  Musculoskeletal:     Cervical back: Normal range of motion and neck supple.  Skin:    General: Skin is warm.  Neurological:     General: No focal  deficit present.     Mental Status: She is alert.  Psychiatric:        Mood and Affect: Mood normal.     ED Course  Medical Decision Making Wheezing improved;  pt states she is feeling better;  SPO2 remained 95% or higher on RA;  cxr neg;   stable for discharge.     Procedures   No results found for this visit on 04/18/23 (from the past 4464 hours).   ED Results No results found for any visits on 04/18/23. XR Chest 2 views Result Date: 04/18/2023 Exam:  Chest Two Views History:  Cough Technique:  2 views Comparison:  06/23/2019 Findings:  The heart size, mediastinal contours, pleural surfaces and pulmonary vascularity are all normal. The lungs are clear. There is no detectable pleural effusion or pneumothorax.   Negative 2 view chest. Signed (Electronic Signature): 04/18/2023 9:38 PM Signed By: Emeline KATHEE Milroy, MD   Medications Administered:  Medications  ipratropium-albuterol  (DUO-NEB) 0.5-2.5 mg/3 mL nebulizer solution 3 mL (3 mL Nebulization Given 04/18/23 2111)  methylPREDNISolone  sodium succinate (PF) (SOLU-Medrol ) injection 125 mg (125 mg Intramuscular Given 04/18/23 2109)  LORazepam (ATIVAN) tablet 1 mg (1 mg Oral Given 04/18/23 2108)    Discharge Medications (Medications Prescribed during this  ED visit and Patient's Home Medications) :    Your Medication List     START taking these medications    dexAMETHasone  4 MG tablet Commonly known as: DECADRON  Take 1.5 tablets (6 mg total) by mouth daily.       ASK your doctor about these medications    albuterol  2.5 mg /3 mL (0.083 %) nebulizer solution Inhale 3 mL (2.5 mg total).   albuterol  90 mcg/actuation inhaler Commonly known as: PROVENTIL  HFA;VENTOLIN  HFA Inhale 2 puffs every six (6) hours as needed.   buPROPion 150 MG 24 hr tablet Commonly known as: Wellbutrin XL Take 1 tablet (150 mg total) by mouth.   clonazePAM 0.5 MG tablet Commonly known as: KlonoPIN Take 1 tablet (0.5 mg total) by mouth.    diclofenac-misoprostol 75-200 mg-mcg per tablet Commonly known as: ARTHROTEC 75 Take 1 tablet by mouth.   dilTIAZem 120 MG 24 hr capsule Commonly known as: CARDIZEM CD Take 1 capsule (120 mg total) by mouth daily.   estradiol 0.5 MG tablet Commonly known as: ESTRACE TAKE 1 TABLET BY MOUTH EVERY DAY   famotidine 40 MG tablet Commonly known as: PEPCID TAKE 1 TABLET BY MOUTH ONCE DAILY AT DINNER TIME   fluticasone propionate 50 mcg/actuation nasal spray Commonly known as: FLONASE 2 sprays into each nostril.   fluticasone propionate 50 mcg/actuation nasal spray Commonly known as: FLONASE 1 spray into each nostril daily.   levocetirizine 5 MG tablet Commonly known as: XYZAL Take 1 tablet (5 mg total) by mouth.   levonorgestrel 21 mcg/24hr (up to 8 yrs) 52 mg IUD Commonly known as: MIRENA 1 each by Intrauterine route.   montelukast 10  mg tablet Commonly known as: SINGULAIR TAKE 1 TABLET EVERY EVENING   pantoprazole 40 MG tablet Commonly known as: Protonix Take 1 tablet (40 mg total) by mouth daily.   predniSONE  10 MG tablet Commonly known as: DELTASONE  Take 4 po qd x 2d then 3 po qd x 2d then 2 po qd x 2d then 1 po qd x 2d then stop   SYMBICORT 160-4.5 mcg/actuation inhaler Generic drug: budesonide-formoterol Inhale 2 puffs two (2) times a day.          Hunter Jeoffrey Murray, [place] 04/18/23 2157

## 2023-04-23 DIAGNOSIS — J4540 Moderate persistent asthma, uncomplicated: Secondary | ICD-10-CM | POA: Diagnosis not present

## 2023-04-25 DIAGNOSIS — J4550 Severe persistent asthma, uncomplicated: Secondary | ICD-10-CM | POA: Diagnosis not present

## 2023-04-25 DIAGNOSIS — Z6836 Body mass index (BMI) 36.0-36.9, adult: Secondary | ICD-10-CM | POA: Diagnosis not present

## 2023-04-25 DIAGNOSIS — U099 Post covid-19 condition, unspecified: Secondary | ICD-10-CM | POA: Diagnosis not present

## 2023-04-25 DIAGNOSIS — G43909 Migraine, unspecified, not intractable, without status migrainosus: Secondary | ICD-10-CM | POA: Diagnosis not present

## 2023-05-24 DIAGNOSIS — J4540 Moderate persistent asthma, uncomplicated: Secondary | ICD-10-CM | POA: Diagnosis not present

## 2023-06-20 DIAGNOSIS — G43909 Migraine, unspecified, not intractable, without status migrainosus: Secondary | ICD-10-CM | POA: Diagnosis not present

## 2023-06-20 DIAGNOSIS — J4550 Severe persistent asthma, uncomplicated: Secondary | ICD-10-CM | POA: Diagnosis not present

## 2023-06-20 DIAGNOSIS — I10 Essential (primary) hypertension: Secondary | ICD-10-CM | POA: Diagnosis not present

## 2023-06-20 DIAGNOSIS — F330 Major depressive disorder, recurrent, mild: Secondary | ICD-10-CM | POA: Diagnosis not present

## 2023-06-20 DIAGNOSIS — U099 Post covid-19 condition, unspecified: Secondary | ICD-10-CM | POA: Diagnosis not present

## 2023-06-20 DIAGNOSIS — F411 Generalized anxiety disorder: Secondary | ICD-10-CM | POA: Diagnosis not present

## 2023-06-23 DIAGNOSIS — J4540 Moderate persistent asthma, uncomplicated: Secondary | ICD-10-CM | POA: Diagnosis not present

## 2023-06-23 DIAGNOSIS — U071 COVID-19: Secondary | ICD-10-CM | POA: Diagnosis not present

## 2023-07-24 DIAGNOSIS — J4540 Moderate persistent asthma, uncomplicated: Secondary | ICD-10-CM | POA: Diagnosis not present

## 2023-07-24 DIAGNOSIS — U071 COVID-19: Secondary | ICD-10-CM | POA: Diagnosis not present

## 2023-08-23 DIAGNOSIS — U071 COVID-19: Secondary | ICD-10-CM | POA: Diagnosis not present

## 2023-08-23 DIAGNOSIS — J4540 Moderate persistent asthma, uncomplicated: Secondary | ICD-10-CM | POA: Diagnosis not present

## 2023-09-23 DIAGNOSIS — U071 COVID-19: Secondary | ICD-10-CM | POA: Diagnosis not present

## 2023-09-23 DIAGNOSIS — J4540 Moderate persistent asthma, uncomplicated: Secondary | ICD-10-CM | POA: Diagnosis not present

## 2023-10-17 DIAGNOSIS — I872 Venous insufficiency (chronic) (peripheral): Secondary | ICD-10-CM | POA: Diagnosis not present

## 2023-10-17 DIAGNOSIS — I10 Essential (primary) hypertension: Secondary | ICD-10-CM | POA: Diagnosis not present

## 2023-10-17 DIAGNOSIS — F330 Major depressive disorder, recurrent, mild: Secondary | ICD-10-CM | POA: Diagnosis not present

## 2023-10-17 DIAGNOSIS — Z0000 Encounter for general adult medical examination without abnormal findings: Secondary | ICD-10-CM | POA: Diagnosis not present

## 2023-10-17 DIAGNOSIS — F411 Generalized anxiety disorder: Secondary | ICD-10-CM | POA: Diagnosis not present

## 2023-10-24 DIAGNOSIS — J4540 Moderate persistent asthma, uncomplicated: Secondary | ICD-10-CM | POA: Diagnosis not present

## 2023-11-23 DIAGNOSIS — J4540 Moderate persistent asthma, uncomplicated: Secondary | ICD-10-CM | POA: Diagnosis not present

## 2023-12-20 ENCOUNTER — Encounter (INDEPENDENT_AMBULATORY_CARE_PROVIDER_SITE_OTHER): Payer: Self-pay | Admitting: *Deleted

## 2023-12-24 DIAGNOSIS — U071 COVID-19: Secondary | ICD-10-CM | POA: Diagnosis not present

## 2023-12-24 DIAGNOSIS — J4540 Moderate persistent asthma, uncomplicated: Secondary | ICD-10-CM | POA: Diagnosis not present

## 2024-01-04 DIAGNOSIS — J208 Acute bronchitis due to other specified organisms: Secondary | ICD-10-CM | POA: Diagnosis not present

## 2024-01-04 DIAGNOSIS — J40 Bronchitis, not specified as acute or chronic: Secondary | ICD-10-CM | POA: Diagnosis not present

## 2024-01-16 ENCOUNTER — Telehealth: Payer: Self-pay | Admitting: *Deleted

## 2024-01-16 NOTE — Telephone Encounter (Signed)
Ok to schedule.  Room :Any   Thanks,  Vista Lawman, MD Gastroenterology and Hepatology [facility]

## 2024-01-16 NOTE — Telephone Encounter (Signed)
 Who is your primary care physician: Dr. Orpha  Reasons for the colonoscopy: screening  Have you had a colonoscopy before?  no  Do you have family history of colon cancer? no  Previous colonoscopy with polyps removed? no  Do you have a history colorectal cancer?   no  Are you diabetic? If yes, Type 1 or Type 2?    no  Do you have a prosthetic or mechanical heart valve? no  Do you have a pacemaker/defibrillator?   no  Have you had endocarditis/atrial fibrillation? no  Have you had joint replacement within the last 12 months?  no  Do you tend to be constipated or have to use laxatives? yes  Do you have any history of drugs or alcohol?  no  Do you use supplemental oxygen ?  no  Have you had a stroke or heart attack within the last 6 months? no  Do you take weight loss medication?  no  For female patients: have you had a hysterectomy?  no                                     are you post menopausal?       no                                            do you still have your menstrual cycle? no      Do you take any blood-thinning medications such as: (aspirin, warfarin, Plavix, Aggrenox)  no  If yes we need the name, milligram, dosage and who is prescribing doctor   Current Outpatient Medications  Medication Sig Dispense Refill   budesonide-formoterol (BREYNA) 80-4.5 MCG/ACT inhaler Inhale 2 puffs into the lungs 2 (two) times daily.     buPROPion (WELLBUTRIN XL) 150 MG 24 hr tablet Take 1 tablet by mouth daily.     cetirizine (ZYRTEC) 10 MG tablet Take 10 mg by mouth daily.     Diclofenac-miSOPROStol 75-0.2 MG TBEC Take 1 tablet by mouth 2 (two) times daily.     diltiazem (CARDIZEM CD) 180 MG 24 hr capsule Take 180 mg by mouth daily.     estradiol (ESTRACE) 0.5 MG tablet Take 0.5 mg by mouth daily.     famotidine (PEPCID) 40 MG tablet Take 40 mg by mouth daily.     fluticasone (FLONASE) 50 MCG/ACT nasal spray Place into both nostrils daily.     montelukast (SINGULAIR) 10 MG  tablet 10 mg.     Multiple Vitamin (MULTIVITAMIN) tablet Take 1 tablet by mouth daily.     olmesartan (BENICAR) 40 MG tablet Take 40 mg by mouth daily.     pantoprazole (PROTONIX) 40 MG tablet Take 40 mg by mouth daily.     No current facility-administered medications for this visit.    Allergies[1]  Pharmacy: [facility]       [1]  Allergies Allergen Reactions   Biaxin [Clarithromycin]    Morphine And Codeine    Penicillins

## 2024-01-23 NOTE — Telephone Encounter (Signed)
 LMOVM to call back

## 2024-01-29 NOTE — Telephone Encounter (Signed)
 Pt advised provider is booked until Feb and don't have his schedule at this time or can get her in sooner with another providers. Pt states Feb would be better due to things she has going on right now. Advised her that will call once we get providers schedule.  Pt left vm wanting to schedule procedure.

## 2024-02-06 ENCOUNTER — Other Ambulatory Visit: Payer: Self-pay | Admitting: *Deleted

## 2024-02-06 ENCOUNTER — Encounter: Payer: Self-pay | Admitting: *Deleted

## 2024-02-06 MED ORDER — PEG 3350-KCL-NA BICARB-NACL 420 G PO SOLR: 4000.0000 mL | Freq: Once | ORAL | 0 refills | Status: AC

## 2024-02-06 NOTE — Telephone Encounter (Signed)
 Pt has been scheduled for 03/08/24. Instructions mailed and prep sent to pharmacy.

## 2024-02-08 ENCOUNTER — Encounter (INDEPENDENT_AMBULATORY_CARE_PROVIDER_SITE_OTHER): Payer: Self-pay | Admitting: *Deleted

## 2024-02-08 NOTE — Telephone Encounter (Signed)
 Referral completed, [facility] apt letter sent to PCP

## 2024-03-08 ENCOUNTER — Encounter (HOSPITAL_COMMUNITY): Admission: RE | Payer: Self-pay | Source: Home / Self Care

## 2024-03-08 ENCOUNTER — Ambulatory Visit (HOSPITAL_COMMUNITY): Admission: RE | Admit: 2024-03-08 | Payer: Self-pay | Admitting: Gastroenterology

## 2024-03-08 SURGERY — COLONOSCOPY
Anesthesia: Choice
